# Patient Record
Sex: Male | Born: 1975 | Race: White | Hispanic: No | Marital: Single | Smoking: Never smoker
Health system: Southern US, Community
[De-identification: ages and names within clinical notes are randomized; demographics above are authoritative.]

## PROBLEM LIST (undated history)

## (undated) DIAGNOSIS — K219 Gastro-esophageal reflux disease without esophagitis: Secondary | ICD-10-CM

## (undated) DIAGNOSIS — K222 Esophageal obstruction: Secondary | ICD-10-CM

## (undated) DIAGNOSIS — D496 Neoplasm of unspecified behavior of brain: Secondary | ICD-10-CM

## (undated) DIAGNOSIS — R51 Headache: Secondary | ICD-10-CM

## (undated) DIAGNOSIS — D429 Neoplasm of uncertain behavior of meninges, unspecified: Secondary | ICD-10-CM

## (undated) DIAGNOSIS — D329 Benign neoplasm of meninges, unspecified: Secondary | ICD-10-CM

## (undated) DIAGNOSIS — R519 Headache, unspecified: Secondary | ICD-10-CM

## (undated) DIAGNOSIS — Z1379 Encounter for other screening for genetic and chromosomal anomalies: Secondary | ICD-10-CM

## (undated) HISTORY — PX: ANKLE SURGERY: SHX546

## (undated) HISTORY — DX: Encounter for other screening for genetic and chromosomal anomalies: Z13.79

## (undated) HISTORY — DX: Neoplasm of uncertain behavior of meninges, unspecified: D42.9

---

## 2001-03-05 DIAGNOSIS — D429 Neoplasm of uncertain behavior of meninges, unspecified: Secondary | ICD-10-CM

## 2001-03-05 HISTORY — DX: Neoplasm of uncertain behavior of meninges, unspecified: D42.9

## 2002-02-17 ENCOUNTER — Encounter: Admission: RE | Admit: 2002-02-17 | Discharge: 2002-02-17 | Payer: Self-pay | Admitting: *Deleted

## 2002-02-17 ENCOUNTER — Encounter: Payer: Self-pay | Admitting: *Deleted

## 2002-02-21 ENCOUNTER — Ambulatory Visit (HOSPITAL_COMMUNITY): Admission: RE | Admit: 2002-02-21 | Discharge: 2002-02-21 | Payer: Self-pay | Admitting: *Deleted

## 2002-02-21 ENCOUNTER — Encounter: Payer: Self-pay | Admitting: *Deleted

## 2002-12-01 ENCOUNTER — Encounter: Admission: RE | Admit: 2002-12-01 | Discharge: 2002-12-01 | Payer: Self-pay | Admitting: Gastroenterology

## 2002-12-01 ENCOUNTER — Encounter: Payer: Self-pay | Admitting: Gastroenterology

## 2007-08-12 ENCOUNTER — Encounter: Admission: RE | Admit: 2007-08-12 | Discharge: 2007-08-12 | Payer: Self-pay | Admitting: Neurosurgery

## 2009-02-02 ENCOUNTER — Emergency Department (HOSPITAL_COMMUNITY): Admission: EM | Admit: 2009-02-02 | Discharge: 2009-02-02 | Payer: Self-pay | Admitting: Emergency Medicine

## 2013-03-01 ENCOUNTER — Emergency Department (HOSPITAL_COMMUNITY)
Admission: EM | Admit: 2013-03-01 | Discharge: 2013-03-01 | Disposition: A | Discharge status: Home / Self Care | Payer: Self-pay | Attending: Emergency Medicine | Admitting: Emergency Medicine

## 2013-03-01 ENCOUNTER — Emergency Department (HOSPITAL_COMMUNITY): Payer: Self-pay

## 2013-03-01 ENCOUNTER — Encounter (HOSPITAL_COMMUNITY): Payer: Self-pay | Admitting: Emergency Medicine

## 2013-03-01 ENCOUNTER — Other Ambulatory Visit: Payer: Self-pay

## 2013-03-01 DIAGNOSIS — R079 Chest pain, unspecified: Secondary | ICD-10-CM | POA: Insufficient documentation

## 2013-03-01 DIAGNOSIS — Z79899 Other long term (current) drug therapy: Secondary | ICD-10-CM | POA: Insufficient documentation

## 2013-03-01 DIAGNOSIS — R112 Nausea with vomiting, unspecified: Secondary | ICD-10-CM | POA: Insufficient documentation

## 2013-03-01 DIAGNOSIS — Z8679 Personal history of other diseases of the circulatory system: Secondary | ICD-10-CM | POA: Insufficient documentation

## 2013-03-01 DIAGNOSIS — R1013 Epigastric pain: Secondary | ICD-10-CM | POA: Insufficient documentation

## 2013-03-01 DIAGNOSIS — Z7982 Long term (current) use of aspirin: Secondary | ICD-10-CM | POA: Insufficient documentation

## 2013-03-01 DIAGNOSIS — K222 Esophageal obstruction: Secondary | ICD-10-CM | POA: Insufficient documentation

## 2013-03-01 HISTORY — DX: Benign neoplasm of meninges, unspecified: D32.9

## 2013-03-01 HISTORY — DX: Esophageal obstruction: K22.2

## 2013-03-01 HISTORY — DX: Neoplasm of unspecified behavior of brain: D49.6

## 2013-03-01 LAB — HEPATIC FUNCTION PANEL
ALT: 40 U/L (ref 0–53)
AST: 34 U/L (ref 0–37)
Alkaline Phosphatase: 68 U/L (ref 39–117)
Bilirubin, Direct: 0.1 mg/dL (ref 0.0–0.3)
Indirect Bilirubin: 0.3 mg/dL (ref 0.3–0.9)
Total Bilirubin: 0.4 mg/dL (ref 0.3–1.2)

## 2013-03-01 LAB — CBC
HCT: 47.4 % (ref 39.0–52.0)
Hemoglobin: 16.6 g/dL (ref 13.0–17.0)
MCH: 30.5 pg (ref 26.0–34.0)
MCV: 87.1 fL (ref 78.0–100.0)
Platelets: 245 10*3/uL (ref 150–400)
RBC: 5.44 MIL/uL (ref 4.22–5.81)
RDW: 12.4 % (ref 11.5–15.5)
WBC: 14 10*3/uL — ABNORMAL HIGH (ref 4.0–10.5)

## 2013-03-01 LAB — BASIC METABOLIC PANEL
BUN: 14 mg/dL (ref 6–23)
CO2: 21 mEq/L (ref 19–32)
Chloride: 104 mEq/L (ref 96–112)
Creatinine, Ser: 0.89 mg/dL (ref 0.50–1.35)
GFR calc non Af Amer: >90 mL/min (ref >90)
Potassium: 3.9 mEq/L (ref 3.5–5.1)

## 2013-03-01 MED ORDER — SODIUM CHLORIDE 0.9 % IV BOLUS (SEPSIS)
1000.0000 mL | Freq: Once | INTRAVENOUS | Status: AC
  Administered 2013-03-01: 1000 mL via INTRAVENOUS

## 2013-03-01 MED ORDER — SUCRALFATE 1 GM/10ML PO SUSP: 1.0000 g | Freq: Three times a day (TID) | ORAL | Status: DC

## 2013-03-01 MED ORDER — ONDANSETRON HCL 4 MG/2ML IJ SOLN
4.0000 mg | Freq: Once | INTRAMUSCULAR | Status: AC
  Administered 2013-03-01: 4 mg via INTRAVENOUS

## 2013-03-01 MED ORDER — IOHEXOL 300 MG/ML  SOLN
50.0000 mL | Freq: Once | INTRAMUSCULAR | Status: AC | PRN
  Administered 2013-03-01: 50 mL via ORAL

## 2013-03-01 MED ORDER — MORPHINE SULFATE 4 MG/ML IJ SOLN
4.0000 mg | Freq: Once | INTRAMUSCULAR | Status: AC
  Administered 2013-03-01: 4 mg via INTRAVENOUS
  Filled 2013-03-01: qty 1

## 2013-03-01 MED ORDER — GI COCKTAIL ~~LOC~~
30.0000 mL | Freq: Once | ORAL | Status: AC
  Administered 2013-03-01: 30 mL via ORAL
  Filled 2013-03-01: qty 30

## 2013-03-01 MED ORDER — ONDANSETRON HCL 4 MG/2ML IJ SOLN
4.0000 mg | Freq: Once | INTRAMUSCULAR | Status: DC
  Filled 2013-03-01: qty 2

## 2013-03-01 MED ORDER — HYDROMORPHONE HCL PF 1 MG/ML IJ SOLN
1.0000 mg | Freq: Once | INTRAMUSCULAR | Status: AC
  Administered 2013-03-01: 1 mg via INTRAVENOUS
  Filled 2013-03-01: qty 1

## 2013-03-01 MED ORDER — PANTOPRAZOLE SODIUM 40 MG IV SOLR
40.0000 mg | Freq: Once | INTRAVENOUS | Status: AC
  Administered 2013-03-01: 40 mg via INTRAVENOUS
  Filled 2013-03-01: qty 40

## 2013-03-01 MED ORDER — SUCRALFATE 1 GM/10ML PO SUSP
1.0000 g | Freq: Once | ORAL | Status: AC
  Administered 2013-03-01: 1 g via ORAL
  Filled 2013-03-01: qty 10

## 2013-03-01 MED ORDER — HYDROCODONE-ACETAMINOPHEN 7.5-325 MG/15ML PO SOLN: 10.0000 mL | Freq: Four times a day (QID) | ORAL | Status: AC | PRN

## 2013-03-01 NOTE — ED Notes (Signed)
Pt actively vomiting in triage; states feels a little better after vomiting

## 2013-03-01 NOTE — ED Provider Notes (Signed)
CSN: [id number]     Arrival date & time 03/01/13  0346 History   First MD Initiated Contact with Patient 03/01/13 0423     Chief Complaint  Patient presents with  . Shortness of Breath   (Consider location/radiation/quality/duration/timing/severity/associated sxs/prior Treatment) HPI Comments: 37 yo male with acute onset of chest pain and vomiting since eating a slice of pizza a few hours before arrival. He vomited the pizza and has increased pain since then. It seems to be in the middle of his chest. The pain is worse with swallowing. Initially has some specks of blood in vomit but none since. Has never experienced pain like this before. Has some dyspnea when the pain is bad. Has a history of food impaction over 10 years ago and has been diagnosed with esophageal stricture. Last saw a gastroenterologist then.   Past Medical History  Diagnosis Date  . Esophageal stricture   . Brain tumor   . Meningioma    Past Surgical History  Procedure Laterality Date  . Ankle surgery     No family history on file. History  Substance Use Topics  . Smoking status: Never Smoker   . Smokeless tobacco: Not on file  . Alcohol Use: Not on file    Review of Systems  Constitutional: Negative for fever.  Respiratory: Negative for cough.   Cardiovascular: Positive for chest pain.  Gastrointestinal: Positive for nausea, vomiting and abdominal pain. Negative for diarrhea.    Allergies  Chicken allergy  Home Medications   Current Outpatient Rx  Name  Route  Sig  Dispense  Refill  . aspirin 325 MG tablet   Oral   Take 325 mg by mouth once.         . cetirizine (ZYRTEC) 10 MG tablet   Oral   Take 10 mg by mouth daily.         . Multiple Vitamin (MULTIVITAMIN WITH MINERALS) TABS tablet   Oral   Take 1 tablet by mouth daily.          BP 145/88  Pulse 91  Temp(Src) 98.8 F (37.1 C)  Resp 20  SpO2 96% Physical Exam  Nursing note and vitals reviewed. Constitutional: He is oriented  to person, place, and time. He appears well-developed and well-nourished.  Dry heaving, appears in pain  HENT:  Head: Normocephalic and atraumatic.  Right Ear: External ear normal.  Left Ear: External ear normal.  Nose: Nose normal.  Eyes: Right eye exhibits no discharge. Left eye exhibits no discharge.  Neck: Neck supple.  Cardiovascular: Normal rate, regular rhythm, normal heart sounds and intact distal pulses.   Pulmonary/Chest: Effort normal. He exhibits no tenderness.  No crepitus  Abdominal: Soft. There is tenderness in the epigastric area.  Musculoskeletal: He exhibits no edema.  Neurological: He is alert and oriented to person, place, and time.  Skin: Skin is warm and dry.    ED Course  Procedures (including critical care time) Labs Review Labs Reviewed  CBC - Abnormal; Notable for the following:    WBC 14.0 (*)    All other components within normal limits  BASIC METABOLIC PANEL - Abnormal; Notable for the following:    Glucose, Bld 152 (*)    All other components within normal limits  LIPASE, BLOOD  HEPATIC FUNCTION PANEL   Imaging Review Dg Chest Port 1 View  03/01/2013   CLINICAL DATA:  Chest pain after vomiting.  Shortness of breath.  EXAM: PORTABLE CHEST - 1 VIEW  COMPARISON:  None.  FINDINGS: The lungs are hypoexpanded but appear grossly clear. Mild vascular crowding is noted. There is no evidence of focal opacification, pleural effusion or pneumothorax.  The cardiomediastinal silhouette is within normal limits. No acute osseous abnormalities are seen.  IMPRESSION: Lungs hypoexpanded but grossly clear.   Electronically Signed   By: Roanna Raider M.D.   On: 03/01/2013 06:11    EKG Interpretation    Date/Time:  Sunday March 01 2013 06:12:22 EST Ventricular Rate:  86 PR Interval:  157 QRS Duration: 93 QT Interval:  352 QTC Calculation: 421 R Axis:   60 Text Interpretation:  Age not entered, assumed to be  37 years old for purpose of ECG interpretation  Sinus rhythm EKG WITHIN NORMAL LIMITS Artifact No old tracing to compare Confirmed by Avaree Gilberti  MD, Talana Slatten ([id number]) on 03/01/2013 7:47:22 AM            MDM   1. Chest pain    Patient's nausea controlled with zofran. Pain moderately helped by morphine and dilaudid. Given GI cocktail and protonix. Pain significantly improved but patient with significant pain each time he swallows. No trouble controlling his secretions. Concern for esophageal injury. CXR benign. D/w Dr. Madilyn Fireman (GI on call), who recommends liquid carafate for better pain control as well as getting a gastrograffin study and then calling back with results. Care transferred to Dr. Karma Ganja with that result pending.     Audree Camel, MD 03/01/13 [phone]

## 2013-03-01 NOTE — ED Notes (Signed)
Pt with history of esophageal stretching in the past; got choked tonight--coughed vigorously at that time; since then has had blood tinged sputum; shortness of breath and "pressure" upper chest with swallowing

## 2013-03-01 NOTE — ED Provider Notes (Signed)
8:13 AM received in signout from Dr. Criss Alvine, awaiting gastrograffin swallow and then will re-call Dr. Madilyn Fireman, GI,  with results.   10:33 AM I have discussed results with Dr. Madilyn Fireman, he recommends d/c home with clear liquids today, then soft mechanical diet tomorrow.  rx for carafate.  Pt to call tomorrow morning to obtain f/u with GI clinic- will need endoscopy as an outpatient.    Ethelda Chick, MD 03/01/13 1034

## 2013-03-01 NOTE — ED Notes (Signed)
Patient transported to X-ray 

## 2013-03-01 NOTE — ED Notes (Addendum)
MD at bedside. 

## 2016-04-26 ENCOUNTER — Encounter (HOSPITAL_BASED_OUTPATIENT_CLINIC_OR_DEPARTMENT_OTHER): Payer: Self-pay | Admitting: *Deleted

## 2016-04-26 ENCOUNTER — Emergency Department (HOSPITAL_BASED_OUTPATIENT_CLINIC_OR_DEPARTMENT_OTHER)
Admission: EM | Admit: 2016-04-26 | Discharge: 2016-04-26 | Disposition: A | Discharge status: Other Acute Inpatient Hospital | Payer: 59 | Attending: Emergency Medicine | Admitting: Emergency Medicine

## 2016-04-26 ENCOUNTER — Encounter (HOSPITAL_COMMUNITY)
Admission: EM | Disposition: A | Discharge status: Other Acute Inpatient Hospital | Payer: Self-pay | Source: Home / Self Care | Attending: Emergency Medicine

## 2016-04-26 DIAGNOSIS — T18128A Food in esophagus causing other injury, initial encounter: Secondary | ICD-10-CM | POA: Diagnosis not present

## 2016-04-26 DIAGNOSIS — Y999 Unspecified external cause status: Secondary | ICD-10-CM | POA: Insufficient documentation

## 2016-04-26 DIAGNOSIS — R0989 Other specified symptoms and signs involving the circulatory and respiratory systems: Secondary | ICD-10-CM | POA: Diagnosis present

## 2016-04-26 DIAGNOSIS — K222 Esophageal obstruction: Secondary | ICD-10-CM | POA: Diagnosis not present

## 2016-04-26 DIAGNOSIS — Y9389 Activity, other specified: Secondary | ICD-10-CM | POA: Insufficient documentation

## 2016-04-26 DIAGNOSIS — Y929 Unspecified place or not applicable: Secondary | ICD-10-CM | POA: Insufficient documentation

## 2016-04-26 DIAGNOSIS — X58XXXA Exposure to other specified factors, initial encounter: Secondary | ICD-10-CM | POA: Insufficient documentation

## 2016-04-26 DIAGNOSIS — R1310 Dysphagia, unspecified: Secondary | ICD-10-CM

## 2016-04-26 HISTORY — PX: ESOPHAGOGASTRODUODENOSCOPY: SHX5428

## 2016-04-26 SURGERY — EGD (ESOPHAGOGASTRODUODENOSCOPY)
Anesthesia: Moderate Sedation

## 2016-04-26 MED ORDER — FENTANYL CITRATE (PF) 100 MCG/2ML IJ SOLN
INTRAMUSCULAR | Status: AC
  Filled 2016-04-26: qty 4

## 2016-04-26 MED ORDER — FENTANYL CITRATE (PF) 100 MCG/2ML IJ SOLN
INTRAMUSCULAR | Status: DC | PRN
  Administered 2016-04-26 (×4): 25 ug via INTRAVENOUS

## 2016-04-26 MED ORDER — GLUCAGON HCL RDNA (DIAGNOSTIC) 1 MG IJ SOLR
1.0000 mg | Freq: Once | INTRAMUSCULAR | Status: AC
  Administered 2016-04-26: 1 mg via INTRAMUSCULAR
  Filled 2016-04-26: qty 1

## 2016-04-26 MED ORDER — DIPHENHYDRAMINE HCL 50 MG/ML IJ SOLN
INTRAMUSCULAR | Status: AC
  Filled 2016-04-26: qty 1

## 2016-04-26 MED ORDER — MIDAZOLAM HCL 10 MG/2ML IJ SOLN
INTRAMUSCULAR | Status: DC | PRN
  Administered 2016-04-26: 2 mg via INTRAVENOUS
  Administered 2016-04-26 (×2): 1 mg via INTRAVENOUS
  Administered 2016-04-26 (×3): 2 mg via INTRAVENOUS

## 2016-04-26 MED ORDER — OMEPRAZOLE 20 MG PO CPDR: 20.0000 mg | DELAYED_RELEASE_CAPSULE | Freq: Every day | ORAL | 0 refills | Status: DC

## 2016-04-26 MED ORDER — MIDAZOLAM HCL 5 MG/ML IJ SOLN
INTRAMUSCULAR | Status: AC
  Filled 2016-04-26: qty 3

## 2016-04-26 MED ORDER — BUTAMBEN-TETRACAINE-BENZOCAINE 2-2-14 % EX AERO
INHALATION_SPRAY | CUTANEOUS | Status: DC | PRN
  Administered 2016-04-26: 2 via TOPICAL

## 2016-04-26 MED ORDER — DIPHENHYDRAMINE HCL 50 MG/ML IJ SOLN
INTRAMUSCULAR | Status: DC | PRN
  Administered 2016-04-26 (×4): 12.5 mg via INTRAVENOUS

## 2016-04-26 NOTE — H&P (View-Only) (Signed)
Referring Provider: Dr. Regenia Skeeter Primary Care Physician:  Helane Rima, MD Primary Gastroenterologist:  Dr. Oletta Lamas  Reason for Consultation:  Food Impaction  HPI: David Contreras is a 41 y.o. male with history of an esophageal stricture and reported dilation in the past by Dr. Oletta Lamas (unable to find EGD report on Epic or on office chart) who has intermittent solid food dysphagia over the years. While eating steak yesterday he felt it get stuck after finishing half of the meal and has been unable to swallow saliva or fluids since then. Denies vomiting up any of the food. Trouble usually happens with steak. Denies heartburn. Denies abdominal pain.  Past Medical History:  Diagnosis Date  . Brain tumor ([place])   . Esophageal stricture   . Meningioma [facility])     Past Surgical History:  Procedure Laterality Date  . ANKLE SURGERY      Prior to Admission medications   Medication Sig Start Date End Date Taking? Authorizing Provider  fexofenadine (ALLEGRA) 180 MG tablet Take 180 mg by mouth daily.   Yes Historical Provider, MD  Multiple Vitamin (MULTIVITAMIN WITH MINERALS) TABS tablet Take 1 tablet by mouth daily.   Yes Historical Provider, MD  aspirin 325 MG tablet Take 325 mg by mouth once.    Historical Provider, MD  cetirizine (ZYRTEC) 10 MG tablet Take 10 mg by mouth daily.    Historical Provider, MD    Scheduled Meds: Continuous Infusions: PRN Meds:.  Allergies as of 04/26/2016 - Review Complete 04/26/2016  Allergen Reaction Noted  . Chicken allergy Swelling 03/01/2013    History reviewed. No pertinent family history.  Social History   Social History  . Marital status: Single    Spouse name: N/A  . Number of children: N/A  . Years of education: N/A   Occupational History  . Not on file.   Social History Main Topics  . Smoking status: Never Smoker  . Smokeless tobacco: Never Used  . Alcohol use Yes  . Drug use: Unknown  . Sexual activity: Not on file   Other  Topics Concern  . Not on file   Social History Narrative  . No narrative on file    Review of Systems: All negative except as stated above in HPI.  Physical Exam: Vital signs: Vitals:   04/26/16 1950 04/26/16 1955  BP: 150/85 151/86  Pulse: 83 71  Resp: 20 21  Temp:       General:   Alert,  Well-developed, well-nourished, pleasant and cooperative in NAD HEENT: anicteric sclera, oropharynx clear Neck: supple, nontender Lungs:  Clear throughout to auscultation.   No wheezes, crackles, or rhonchi. No acute distress. Heart:  Regular rate and rhythm; no murmurs, clicks, rubs,  or gallops. Abdomen: soft, nontender, nondistended, +BS  Rectal:  Deferred Ext: no edema  GI:  Lab Results: No results for input(s): WBC, HGB, HCT, PLT in the last 72 hours. BMET No results for input(s): NA, K, CL, CO2, GLUCOSE, BUN, CREATININE, CALCIUM in the last 72 hours. LFT No results for input(s): PROT, ALBUMIN, AST, ALT, ALKPHOS, BILITOT, BILIDIR, IBILI in the last 72 hours. PT/INR No results for input(s): LABPROT, INR in the last 72 hours.   Studies/Results: No results found.  Impression/Plan: 41 yo with intermittent solid food dysphagia with presentation concerning for a food impaction. 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 Will do EGD for clearance of food impaction and he will need f/u EGD in March for dilation of his esophagus. Will do EGD at  bedside in ER. His father is with him to drive him home.    LOS: 0 days   [place]ier C.  04/26/2016, 8:01 PM  Pager [phone]  If no answer or after 5 PM call [phone]

## 2016-04-26 NOTE — ED Provider Notes (Signed)
[facility] DE[facility] Provider Note   CSN: [id number] Arrival date & time: 04/26/16  1337     History   Chief Complaint Chief Complaint  Patient presents with  . Esophageal Stricture    HPI David Contreras is a 41 y.o. male.  HPI Patient has a previous history of esophageal stricture. Has had food get caught in the past and has had to even get it endoscopically removed. States has seen Dr. Oletta Lamas in the past. Around 24 hours ago he had some steak and felt it get caught. States he has been able to eat or drink anything since then. He has been spitting up his secretions. No fevers. States he has eaten very well today.   Past Medical History:  Diagnosis Date  . Brain tumor ([place])   . Esophageal stricture   . Meningioma ([place])     There are no active problems to display for this patient.   Past Surgical History:  Procedure Laterality Date  . ANKLE SURGERY         Home Medications    Prior to Admission medications   Medication Sig Start Date End Date Taking? Authorizing Provider  aspirin 325 MG tablet Take 325 mg by mouth once.    Historical Provider, MD  cetirizine (ZYRTEC) 10 MG tablet Take 10 mg by mouth daily.    Historical Provider, MD  Multiple Vitamin (MULTIVITAMIN WITH MINERALS) TABS tablet Take 1 tablet by mouth daily.    Historical Provider, MD  sucralfate (CARAFATE) 1 GM/10ML suspension Take 10 mLs (1 g total) by mouth 4 (four) times daily -  with meals and at bedtime. 03/01/13   Alfonzo Beers, MD    Family History History reviewed. No pertinent family history.  Social History Social History  Substance Use Topics  . Smoking status: Never Smoker  . Smokeless tobacco: Never Used  . Alcohol use Yes     Allergies   Chicken allergy   Review of Systems Review of Systems  Constitutional: Negative for activity change, appetite change and fever.  HENT: Negative for ear pain.   Eyes: Negative for pain.  Respiratory: Negative for chest tightness  and shortness of breath.   Cardiovascular: Negative for chest pain and leg swelling.  Gastrointestinal: Positive for nausea and vomiting. Negative for abdominal pain, constipation and diarrhea.  Genitourinary: Negative for flank pain.  Musculoskeletal: Negative for back pain and neck stiffness.  Skin: Negative for rash.  Neurological: Negative for weakness, numbness and headaches.  Psychiatric/Behavioral: Negative for behavioral problems.     Physical Exam Updated Vital Signs BP 127/85 (BP Location: Left Arm)   Pulse 73   Temp 98.6 F (37 C) (Oral)   Resp 18   Ht 5\' 11"  (1.803 m)   Wt 236 lb (107 kg)   SpO2 98%   BMI 32.92 kg/m   Physical Exam  Constitutional: He appears well-developed.  HENT:  Head: Atraumatic.  Eyes: EOM are normal.  Neck: Neck supple.  Cardiovascular: Normal rate.   Pulmonary/Chest: Effort normal.  Abdominal: Soft.  Musculoskeletal: He exhibits no edema.  Neurological: He is alert.  Skin: Skin is warm. Capillary refill takes less than 2 seconds.     ED Treatments / Results  Labs (all labs ordered are listed, but only abnormal results are displayed) Labs Reviewed - No data to display  EKG  EKG Interpretation None       Radiology No results found.  Procedures Procedures (including critical care time)  Medications Ordered in ED  Medications  glucagon (human recombinant) (GLUCAGEN) injection 1 mg (1 mg Intramuscular Given 04/26/16 1530)     Initial Impression / Assessment and Plan / ED Course  I have reviewed the triage vital signs and the nursing notes.  Pertinent labs & imaging results that were available during my care of the patient were reviewed by me and considered in my medical decision making (see chart for details).     Patient with likely esophageal meat impaction. History of stricture that has required dilation in the past. Unable to pass fluids here. Discussed with Dr. Amedeo Plenty and Dr. Laverta Baltimore will transfer to [facility] to be seen by [facility].  Final Clinical Impressions(s) / ED Diagnoses   Final diagnoses:  Esophageal obstruction due to food impaction    New Prescriptions New Prescriptions   No medications on file     [name], MD 04/26/16 1735

## 2016-04-26 NOTE — Consult Note (Signed)
Referring Provider: Dr. Regenia Skeeter Primary Care Physician:  Helane Rima, MD Primary Gastroenterologist:  Dr. Oletta Lamas  Reason for Consultation:  Food Impaction  HPI: David Contreras is a 41 y.o. male with history of an esophageal stricture and reported dilation in the past by Dr. Oletta Lamas (unable to find EGD report on Epic or on office chart) who has intermittent solid food dysphagia over the years. While eating steak yesterday he felt it get stuck after finishing half of the meal and has been unable to swallow saliva or fluids since then. Denies vomiting up any of the food. Trouble usually happens with steak. Denies heartburn. Denies abdominal pain.  Past Medical History:  Diagnosis Date  . Brain tumor ([place])   . Esophageal stricture   . Meningioma [facility])     Past Surgical History:  Procedure Laterality Date  . ANKLE SURGERY      Prior to Admission medications   Medication Sig Start Date End Date Taking? Authorizing Provider  fexofenadine (ALLEGRA) 180 MG tablet Take 180 mg by mouth daily.   Yes Historical Provider, MD  Multiple Vitamin (MULTIVITAMIN WITH MINERALS) TABS tablet Take 1 tablet by mouth daily.   Yes Historical Provider, MD  aspirin 325 MG tablet Take 325 mg by mouth once.    Historical Provider, MD  cetirizine (ZYRTEC) 10 MG tablet Take 10 mg by mouth daily.    Historical Provider, MD    Scheduled Meds: Continuous Infusions: PRN Meds:.  Allergies as of 04/26/2016 - Review Complete 04/26/2016  Allergen Reaction Noted  . Chicken allergy Swelling 03/01/2013    History reviewed. No pertinent family history.  Social History   Social History  . Marital status: Single    Spouse name: N/A  . Number of children: N/A  . Years of education: N/A   Occupational History  . Not on file.   Social History Main Topics  . Smoking status: Never Smoker  . Smokeless tobacco: Never Used  . Alcohol use Yes  . Drug use: Unknown  . Sexual activity: Not on file   Other  Topics Concern  . Not on file   Social History Narrative  . No narrative on file    Review of Systems: All negative except as stated above in HPI.  Physical Exam: Vital signs: Vitals:   04/26/16 1950 04/26/16 1955  BP: 150/85 151/86  Pulse: 83 71  Resp: 20 21  Temp:       General:   Alert,  Well-developed, well-nourished, pleasant and cooperative in NAD HEENT: anicteric sclera, oropharynx clear Neck: supple, nontender Lungs:  Clear throughout to auscultation.   No wheezes, crackles, or rhonchi. No acute distress. Heart:  Regular rate and rhythm; no murmurs, clicks, rubs,  or gallops. Abdomen: soft, nontender, nondistended, +BS  Rectal:  Deferred Ext: no edema  GI:  Lab Results: No results for input(s): WBC, HGB, HCT, PLT in the last 72 hours. BMET No results for input(s): NA, K, CL, CO2, GLUCOSE, BUN, CREATININE, CALCIUM in the last 72 hours. LFT No results for input(s): PROT, ALBUMIN, AST, ALT, ALKPHOS, BILITOT, BILIDIR, IBILI in the last 72 hours. PT/INR No results for input(s): LABPROT, INR in the last 72 hours.   Studies/Results: No results found.  Impression/Plan: 41 yo with intermittent solid food dysphagia with presentation concerning for a food impaction. 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 Will do EGD for clearance of food impaction and he will need f/u EGD in March for dilation of his esophagus. Will do EGD at  bedside in ER. His father is with him to drive him home.    LOS: 0 days   [place] C.  04/26/2016, 8:01 PM  Pager [phone]  If no answer or after 5 PM call [phone]

## 2016-04-26 NOTE — Discharge Instructions (Signed)
Only clear liquids tonight such as water, juice (non-citric juices), and soda. Starting tomorrow can try soups and broths and if tolerated advance to soft diet. NO meats at all until repeat EGD done next week. Start Omeprazole 20 mg once a day starting tomorrow. [facility] will call to arrange EGD.

## 2016-04-26 NOTE — ED Provider Notes (Signed)
Patient transferred from [facility] with food impaction. GI contacted by Charge nurse. Stable. Waiting on GI to remove impaction  Food impaction removed. Patient will be discharged home to follow-up with [facility] GI as an outpatient.   Sherwood Gambler, MD 04/26/16 2158

## 2016-04-26 NOTE — Interval H&P Note (Signed)
History and Physical Interval Note:  04/26/2016 8:12 PM  David Contreras  has presented today for surgery, with the diagnosis of food impaction  The various methods of treatment have been discussed with the patient and family. After consideration of risks, benefits and other options for treatment, the patient has consented to  Procedure(s): ESOPHAGOGASTRODUODENOSCOPY (EGD) (N/A) as a surgical intervention .  The patient's history has been reviewed, patient examined, no change in status, stable for surgery.  I have reviewed the patient's chart and labs.  Questions were answered to the patient's satisfaction.     [place] C.

## 2016-04-26 NOTE — ED Notes (Signed)
Pt arrived to [facility].  Will notify provider.

## 2016-04-26 NOTE — ED Triage Notes (Signed)
He has food stuck in his esophagus. Hx of same. He is not able to swallow water without reflux. No respiratory problems.

## 2016-04-26 NOTE — Op Note (Signed)
[facility] Patient Name: David Contreras Procedure Date: 04/26/2016 MRN: [id number] Attending MD: Lear Ng , MD Date of Birth: [birth year] CSN: [id number] Age: 41 Admit Type: Outpatient Procedure:                Upper GI endoscopy Indications:              Dysphagia, Foreign body in the esophagus Providers:                Lear Ng, MD, Dortha Schwalbe RN, RN,                            Alfonso Patten, Technician Referring MD:              Medicines:                Fentanyl 100 micrograms IV, Midazolam 10 mg IV,                            Diphenhydramine 50 mg IV, Cetacaine spray Complications:            No immediate complications. Estimated Blood Loss:     Estimated blood loss was minimal. Procedure:                Pre-Anesthesia Assessment:                           - Prior to the procedure, a History and Physical                            was performed, and patient medications and                            allergies were reviewed. The patient's tolerance of                            previous anesthesia was also reviewed. The risks                            and benefits of the procedure and the sedation                            options and risks were discussed with the patient.                            All questions were answered, and informed consent                            was obtained. Prior Anticoagulants: The patient has                            taken no previous anticoagulant or antiplatelet                            agents. ASA Grade Assessment: I - A normal, healthy  patient. After reviewing the risks and benefits,                            the patient was deemed in satisfactory condition to                            undergo the procedure.                           After obtaining informed consent, the endoscope was                            passed under direct vision. Throughout the                             procedure, the patient's blood pressure, pulse, and                            oxygen saturations were monitored continuously. The                            [id number]) scope was introduced through the                            mouth, and advanced to the lower third of                            esophagus. The upper GI endoscopy was performed                            with difficulty due to stricture. Successful                            completion of the procedure was aided by {skip}. Scope In: Scope Out: Findings:      One severe (stenosis; an endoscope cannot pass) benign-appearing,       intrinsic stenosis was found 28 cm from the incisors. Standard endoscope       (9 mm diameter) would not pass and pediatric endoscope (8 mm diameter)       would not pass. No smaller endoscope available. Stricture less than 8 mm       and was not traversed. The lesion was not amenable to dilation, and this       was not attempted due to trauma from the food impaction.      Food was found in the mid esophagus. Removal of food was accomplished.      Unable to advance standard or pediatric endoscope through the stricture       to evaluate the remaining portion of the esophagus or visualize the       stomach or duodenum.      Mucosal changes including longitudinal furrows were found in the mid       esophagus. Impression:               - Benign-appearing esophageal stenosis. Lesion not  amenable to dilation, and not attempted.                           - Food in the mid esophagus. Removal was successful.                           - Esophageal mucosal changes suspicious for                            eosinophilic esophagitis. Moderate Sedation:      Moderate (conscious) sedation was administered by the endoscopy nurse       and supervised by the endoscopist. The following parameters were       monitored: oxygen saturation, heart rate, blood pressure, and  response       to care. Recommendation:           - Patient has a contact number available for                            emergencies. The signs and symptoms of potential                            delayed complications were discussed with the                            patient. Return to normal activities tomorrow.                            Written discharge instructions were provided to the                            patient.                           - Clear liquid diet tonight and then start full                            liquids tomorrow and advance as tolerated to soft                            foods. NO meats until repeat EGD with dilation done.                           - Repeat upper endoscopy in 1 week for dilation of                            the stricture.                           - Use Prilosec (omeprazole) 20 mg PO daily.                           - Post procedure medication orders were given. Procedure Code(s):        --- Professional ---  [id number], Esophagoscopy, flexible, transoral; with                            removal of foreign body(s) Diagnosis Code(s):        --- Professional ---                           K22.2, Esophageal obstruction                           [id number], Food in esophagus causing other injury,                            initial encounter                           T18.108A, Unspecified foreign body in esophagus                            causing other injury, initial encounter                           R13.10, Dysphagia, unspecified                           K20.9, Esophagitis, unspecified CPT copyright 2016 [employer]. All rights reserved. The codes documented in this report are preliminary and upon coder review may  be revised to meet current compliance requirements. Lear Ng, MD 04/26/2016 9:00:25 PM This report has been signed electronically. Number of Addenda: 0

## 2016-04-26 NOTE — ED Notes (Signed)
States" I ate steak about 24 hrs ago and I feel like I have a piece struck in my throat" Has had prior incident of same and will resolve in 5 hrs or less. Denies pain

## 2016-04-26 NOTE — ED Notes (Signed)
To [facility], via POV, no questions regarding transfer.

## 2016-04-26 NOTE — ED Notes (Signed)
Post procedure report from nurse Dr. Michail Sermon wants all liquid diet  Pt needs to return next week to stretch esophageal lining  100 of fentyle  10 of versed  50 benadryl

## 2016-04-26 NOTE — ED Notes (Signed)
Placed on monitor and v/s check every 5 mins per nurse request

## 2016-04-26 NOTE — ED Notes (Signed)
ED Provider at bedside.Denies pain but no change in FB in throat . Unable to tol fluids

## 2016-04-26 NOTE — ED Notes (Signed)
Bed: WA25 Expected date:  Expected time:  Means of arrival:  Comments: [facility] 

## 2016-04-26 NOTE — ED Notes (Signed)
Patient denies pain and is resting comfortably.  

## 2016-04-26 NOTE — Brief Op Note (Signed)
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 Food removed but unable to pass a standard endoscope or a pediatric endoscope through the stricture. Clear liquid diet only today and then tomorrow can do full liquids and advance to soft food. No meat at all until dilation done and will plan to do dilation next week. Start Omeprazole 20 mg once a day starting tomorrow. Office will call to arrange. D/W his father.

## 2016-04-26 NOTE — ED Triage Notes (Signed)
Pt here with food bolus.  Eating steak and feels it is caught

## 2016-04-27 ENCOUNTER — Other Ambulatory Visit: Payer: Self-pay | Admitting: Gastroenterology

## 2016-04-27 ENCOUNTER — Encounter (HOSPITAL_COMMUNITY): Payer: Self-pay | Admitting: Gastroenterology

## 2016-05-02 ENCOUNTER — Encounter (HOSPITAL_COMMUNITY): Payer: Self-pay | Admitting: Certified Registered Nurse Anesthetist

## 2016-05-03 ENCOUNTER — Encounter (HOSPITAL_COMMUNITY)
Admission: AD | Disposition: A | Discharge status: Home / Self Care | Payer: Self-pay | Source: Ambulatory Visit | Attending: Gastroenterology

## 2016-05-03 ENCOUNTER — Ambulatory Visit (HOSPITAL_COMMUNITY): Payer: 59 | Admitting: Anesthesiology

## 2016-05-03 ENCOUNTER — Encounter (HOSPITAL_COMMUNITY): Payer: Self-pay | Admitting: *Deleted

## 2016-05-03 ENCOUNTER — Encounter (HOSPITAL_COMMUNITY): Admission: RE | Payer: Self-pay | Source: Ambulatory Visit

## 2016-05-03 ENCOUNTER — Ambulatory Visit (HOSPITAL_COMMUNITY): Admission: RE | Admit: 2016-05-03 | Payer: 59 | Source: Ambulatory Visit | Admitting: Gastroenterology

## 2016-05-03 ENCOUNTER — Ambulatory Visit (HOSPITAL_COMMUNITY): Payer: 59

## 2016-05-03 ENCOUNTER — Ambulatory Visit (HOSPITAL_COMMUNITY)
Admission: AD | Admit: 2016-05-03 | Discharge: 2016-05-03 | Disposition: A | Discharge status: Home / Self Care | Payer: 59 | Source: Ambulatory Visit | Attending: Gastroenterology | Admitting: Gastroenterology

## 2016-05-03 DIAGNOSIS — K2900 Acute gastritis without bleeding: Secondary | ICD-10-CM | POA: Diagnosis not present

## 2016-05-03 DIAGNOSIS — K222 Esophageal obstruction: Secondary | ICD-10-CM | POA: Diagnosis not present

## 2016-05-03 DIAGNOSIS — Z7982 Long term (current) use of aspirin: Secondary | ICD-10-CM | POA: Diagnosis not present

## 2016-05-03 DIAGNOSIS — Z86011 Personal history of benign neoplasm of the brain: Secondary | ICD-10-CM | POA: Insufficient documentation

## 2016-05-03 DIAGNOSIS — Z79899 Other long term (current) drug therapy: Secondary | ICD-10-CM | POA: Insufficient documentation

## 2016-05-03 HISTORY — PX: ESOPHAGOGASTRODUODENOSCOPY (EGD) WITH PROPOFOL: SHX5813

## 2016-05-03 HISTORY — PX: BALLOON DILATION: SHX5330

## 2016-05-03 SURGERY — ESOPHAGOGASTRODUODENOSCOPY (EGD) WITH PROPOFOL
Anesthesia: Monitor Anesthesia Care

## 2016-05-03 MED ORDER — LIDOCAINE 2% (20 MG/ML) 5 ML SYRINGE
INTRAMUSCULAR | Status: DC | PRN
  Administered 2016-05-03 (×2): 80 mg via INTRAVENOUS

## 2016-05-03 MED ORDER — EPHEDRINE SULFATE-NACL 50-0.9 MG/10ML-% IV SOSY
PREFILLED_SYRINGE | INTRAVENOUS | Status: DC | PRN
  Administered 2016-05-03: 5 mg via INTRAVENOUS
  Administered 2016-05-03: 10 mg via INTRAVENOUS

## 2016-05-03 MED ORDER — PROPOFOL 10 MG/ML IV BOLUS
INTRAVENOUS | Status: DC | PRN
  Administered 2016-05-03: 30 mg via INTRAVENOUS
  Administered 2016-05-03 (×5): 50 mg via INTRAVENOUS

## 2016-05-03 MED ORDER — PROPOFOL 500 MG/50ML IV EMUL
INTRAVENOUS | Status: DC | PRN
  Administered 2016-05-03: 120 ug/kg/min via INTRAVENOUS

## 2016-05-03 MED ORDER — SODIUM CHLORIDE 0.9 % IV SOLN: INTRAVENOUS | Status: DC

## 2016-05-03 MED ORDER — GLYCOPYRROLATE 0.2 MG/ML IV SOSY
PREFILLED_SYRINGE | INTRAVENOUS | Status: DC | PRN
  Administered 2016-05-03: .2 mg via INTRAVENOUS

## 2016-05-03 MED ORDER — PHENYLEPHRINE 40 MCG/ML (10ML) SYRINGE FOR IV PUSH (FOR BLOOD PRESSURE SUPPORT)
PREFILLED_SYRINGE | INTRAVENOUS | Status: DC | PRN
  Administered 2016-05-03: 80 ug via INTRAVENOUS
  Administered 2016-05-03 (×2): 120 ug via INTRAVENOUS
  Administered 2016-05-03: 80 ug via INTRAVENOUS

## 2016-05-03 MED ORDER — LACTATED RINGERS IV SOLN
INTRAVENOUS | Status: DC | PRN
  Administered 2016-05-03: 10:00:00 via INTRAVENOUS

## 2016-05-03 NOTE — H&P (Signed)
Date of Initial H&P: 04/27/16  History reviewed, patient examined, no change in status, stable for surgery.

## 2016-05-03 NOTE — Transfer of Care (Signed)
Immediate Anesthesia Transfer of Care Note  Patient: MILAD BUBLITZ  Procedure(s) Performed: Procedure(s): ESOPHAGOGASTRODUODENOSCOPY (EGD) WITH PROPOFOL (N/A) BALLOON DILATION (N/A)  Patient Location: PACU and Endoscopy Unit  Anesthesia Type:MAC  Level of Consciousness: awake, alert , oriented and patient cooperative  Airway & Oxygen Therapy: Patient Spontanous Breathing and Patient connected to nasal cannula oxygen  Post-op Assessment: Report given to RN, Post -op Vital signs reviewed and stable and Patient moving all extremities X 4  Post vital signs: Reviewed and stable  Last Vitals:  Vitals:   05/03/16 0923  BP: (!) 168/96  Pulse: 74  Resp: (!) 22  Temp: 36.7 C    Last Pain:  Vitals:   05/03/16 0923  TempSrc: Oral         Complications: No apparent anesthesia complications

## 2016-05-03 NOTE — Discharge Instructions (Signed)

## 2016-05-03 NOTE — Anesthesia Preprocedure Evaluation (Addendum)
Anesthesia Evaluation  Patient identified by MRN, date of birth, ID band Patient awake    Reviewed: Allergy & Precautions, NPO status , Patient's Chart, lab work & pertinent test results  Airway Mallampati: II  TM Distance: >3 FB Neck ROM: Full    Dental   Pulmonary neg pulmonary ROS,    breath sounds clear to auscultation       Cardiovascular negative cardio ROS   Rhythm:Regular Rate:Normal     Neuro/Psych Meningioma    GI/Hepatic Esophageal stricture   Endo/Other  negative endocrine ROS  Renal/GU negative Renal ROS     Musculoskeletal   Abdominal   Peds  Hematology negative hematology ROS (+)   Anesthesia Other Findings   Reproductive/Obstetrics                            Anesthesia Physical Anesthesia Plan  ASA: II  Anesthesia Plan: MAC   Post-op Pain Management:    Induction: Intravenous  Airway Management Planned: Natural Airway and Nasal Cannula  Additional Equipment:   Intra-op Plan:   Post-operative Plan:   Informed Consent: I have reviewed the patients History and Physical, chart, labs and discussed the procedure including the risks, benefits and alternatives for the proposed anesthesia with the patient or authorized representative who has indicated his/her understanding and acceptance.   Dental advisory given  Plan Discussed with:   Anesthesia Plan Comments:         Anesthesia Quick Evaluation

## 2016-05-03 NOTE — Interval H&P Note (Signed)
History and Physical Interval Note:  05/03/2016 9:23 AM  David Contreras  has presented today for surgery, with the diagnosis of dysphagia  The various methods of treatment have been discussed with the patient and family. After consideration of risks, benefits and other options for treatment, the patient has consented to  Procedure(s): ESOPHAGOGASTRODUODENOSCOPY (EGD) WITH PROPOFOL (N/A) BALLOON DILATION (N/A) as a surgical intervention .  The patient's history has been reviewed, patient examined, no change in status, stable for surgery.  I have reviewed the patient's chart and labs.  Questions were answered to the patient's satisfaction.     [place] C.

## 2016-05-03 NOTE — Op Note (Signed)
[facility] Patient Name: David Contreras Procedure Date : 05/03/2016 MRN: [id number] Attending MD: Lear Ng , MD Date of Birth: 1975/11/02 CSN: [id number] Age: 41 Admit Type: Outpatient Procedure:                Upper GI endoscopy Indications:              Dysphagia, Stricture of the esophagus Providers:                Lear Ng, MD, Vista Lawman, RN, Elspeth Cho Tech., Technician, Cathe Mons, CRNA Referring MD:              Medicines:                Propofol per Anesthesia, Monitored Anesthesia Care Complications:            No immediate complications. Estimated Blood Loss:     Estimated blood loss was minimal. Procedure:                Pre-Anesthesia Assessment:                           - Prior to the procedure, a History and Physical                            was performed, and patient medications and                            allergies were reviewed. The patient's tolerance of                            previous anesthesia was also reviewed. The risks                            and benefits of the procedure and the sedation                            options and risks were discussed with the patient.                            All questions were answered, and informed consent                            was obtained. Prior Anticoagulants: The patient has                            taken no previous anticoagulant or antiplatelet                            agents. ASA Grade Assessment: III - A patient with                            severe systemic disease. After reviewing the risks  and benefits, the patient was deemed in                            satisfactory condition to undergo the procedure.                           After obtaining informed consent, the endoscope was                            passed under direct vision. Throughout the                            procedure, the  patient's blood pressure, pulse, and                            oxygen saturations were monitored continuously. The                            EG-[id number] ([id number]) scope was introduced through the                            mouth, and advanced to the second part of duodenum.                            The upper GI endoscopy was accomplished without                            difficulty. The patient tolerated the procedure                            well. Scope In: Scope Out: Findings:      One severe (stenosis; an endoscope cannot pass) benign-appearing,       intrinsic stenosis was found 28 cm from the incisors. And was traversed       after dilation. A guidewire was placed under fluoroscopic guidance and       the scope was withdrawn. Dilation was performed with a Savary dilator       with no resistance at 7 mm, mild resistance at 8 mm, 9 mm and 10 mm and       moderate resistance at 11 mm and 12 mm. Estimated blood loss was minimal.      Localized mild inflammation characterized by congestion (edema) and       erythema was found in the gastric antrum.      The examined duodenum was normal.      The Z-line was regular and was found 40 cm from the incisors. Impression:               - Benign-appearing esophageal stenosis. Dilated.                           - Acute gastritis.                           - Normal examined duodenum.                           -  No specimens collected. Moderate Sedation:      N/A - MAC procedure Recommendation:           - Use Prilosec (omeprazole) 20 mg PO daily.                           - Return to GI office in 2 months.                           - Advance diet as tolerated and clear liquid diet.                           - Post procedure medication orders were given.                           - Patient has a contact number available for                            emergencies. The signs and symptoms of potential                            delayed  complications were discussed with the                            patient. Return to normal activities tomorrow.                            Written discharge instructions were provided to the                            patient. Procedure Code(s):        --- Professional ---                           [phone], Esophagogastroduodenoscopy, flexible,                            transoral; with insertion of guide wire followed by                            passage of dilator(s) through esophagus over guide                            wire Diagnosis Code(s):        --- Professional ---                           R13.10, Dysphagia, unspecified                           K22.2, Esophageal obstruction                           K29.00, Acute gastritis without bleeding CPT copyright 2016 American Medical Association. All rights reserved. The codes documented in this report are preliminary and upon coder review may  be revised to meet current compliance  requirements. Lear Ng, MD 05/03/2016 10:28:51 AM This report has been signed electronically. Number of Addenda: 0

## 2016-05-04 ENCOUNTER — Encounter (HOSPITAL_COMMUNITY): Payer: Self-pay | Admitting: Gastroenterology

## 2016-05-04 NOTE — Anesthesia Postprocedure Evaluation (Signed)
Anesthesia Post Note  Patient: David Contreras  Procedure(s) Performed: Procedure(s) (LRB): ESOPHAGOGASTRODUODENOSCOPY (EGD) WITH PROPOFOL (N/A) BALLOON DILATION (N/A)  Patient location during evaluation: PACU Anesthesia Type: MAC Level of consciousness: awake and alert Pain management: pain level controlled Vital Signs Assessment: post-procedure vital signs reviewed and stable Respiratory status: spontaneous breathing, nonlabored ventilation, respiratory function stable and patient connected to nasal cannula oxygen Cardiovascular status: stable and blood pressure returned to baseline Anesthetic complications: no       Last Vitals:  Vitals:   05/03/16 1035 05/03/16 1040  BP: 135/75   Pulse: 74 65  Resp: 17 19  Temp:      Last Pain:  Vitals:   05/03/16 1025  TempSrc: Oral                 Tiajuana Amass

## 2017-04-30 ENCOUNTER — Other Ambulatory Visit: Payer: Self-pay | Admitting: Neurosurgery

## 2017-04-30 ENCOUNTER — Other Ambulatory Visit (HOSPITAL_COMMUNITY): Payer: Self-pay | Admitting: Neurosurgery

## 2017-04-30 DIAGNOSIS — D329 Benign neoplasm of meninges, unspecified: Secondary | ICD-10-CM

## 2017-05-03 ENCOUNTER — Inpatient Hospital Stay: Payer: 59

## 2017-05-03 ENCOUNTER — Encounter: Payer: Self-pay | Admitting: Genetic Counselor

## 2017-05-03 ENCOUNTER — Inpatient Hospital Stay: Payer: 59 | Attending: Genetic Counselor | Admitting: Genetic Counselor

## 2017-05-03 DIAGNOSIS — Z7183 Encounter for nonprocreative genetic counseling: Secondary | ICD-10-CM | POA: Diagnosis not present

## 2017-05-03 DIAGNOSIS — D429 Neoplasm of uncertain behavior of meninges, unspecified: Secondary | ICD-10-CM | POA: Diagnosis not present

## 2017-05-03 DIAGNOSIS — Z800 Family history of malignant neoplasm of digestive organs: Secondary | ICD-10-CM | POA: Diagnosis not present

## 2017-05-03 NOTE — Progress Notes (Signed)
[facility]      Initial Visit   Patient Name: David Contreras Patient DOB: [birth year] Patient Age: 42 y.o. Encounter Date: 05/03/2017  Referring Provider: Erline Levine, MD  Primary Care Provider: Helane Rima, MD  Reason for Visit: Evaluate for hereditary susceptibility to cancer    Assessment and Plan:  . Mr. Carlile' personal history of multiple meningiomas warrants a genetics evaluation, but he has no other features suggestive of a syndromic cause to these. He does not have vestibular schwannomas that are typical in NF2 or any other NF2-associated findings other than meningiomas. Mutations in [place] can be associated with multiple meningiomas, but would also manifest with multiple schwannomas, which he does not have. The great majority of individuals with multiple meningiomas do not harbor a SMARCB1 mutation.  . Testing is recommended to determine whether he has a pathogenic mutation that will impact his future screening.  . Mr. Koudelka wished to pursue genetic testing and a blood sample will be sent for analysis of the 83 genes on [facility]'s Multi-Cancer panel (ALK, APC, ATM, AXIN2, BAP1, BARD1, BLM, BMPR1A, BRCA1, BRCA2, BRIP1, CASR, CDC73, CDH1, CDK4, CDKN1B, CDKN1C, CDKN2A, CEBPA, CHEK2, CTNNA1, DICER1, DIS3L2, EGFR, EPCAM, FH, FLCN, GATA2, GPC3, GREM1, HOXB13, HRAS, KIT, MAX, MEN1, MET, MITF, MLH1, MSH2, MSH3, MSH6, MUTYH, NBN, NF1, NF2, NTHL1, PALB2, PDGFRA, PHOX2B, PMS2, POLD1, POLE, POT1, PRKAR1A, PTCH1, PTEN, RAD50, RAD51C, RAD51D, RB1, RECQL4, RET, RUNX1, SDHA, SDHAF2, SDHB, SDHC, SDHD, SMAD4, SMARCA4, SMARCB1, SMARCE1, STK11, SUFU, TERC, TERT, TMEM127, TP53, TSC1, TSC2, VHL, WRN, WT1).   . Results should be available in approximately 2-4 weeks, at which point we will contact him and address implications for him as well as address genetic testing for at-risk family members, if needed.      Dr. Jana Hakim was available for questions concerning  this case. Total time spent by me in face-to-face counseling was approximately 35 minutes.   _____________________________________________________________________   History of Present Illness: Mr. David Contreras, a 42 y.o. male, is being seen at the [facility] due to a personal history of multiple meningiomas. He presents to clinic today to discuss the possibility of a hereditary predisposition to cancer and discuss whether genetic testing is warranted.  Mr. [name] has a history of meningiomas identified in his early 21s. He reported that in 2003, he had evaluation of a bony growth on his forehead that lead to a CT scan as well as an MRI. 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   He then had follow-up imaging every 2 years (2005, 2007 and 2009) which did not show enlargement of any of the meningiomas. He did not have another MRI until 2019, at which point it was noted that 2 of the meningiomas had enlarged and were now appearing fused. He is scheduled for surgery later this month.   Past Medical History:  Diagnosis Date  . Brain tumor ([place])   . Esophageal stricture   . Meningioma ([place])   . [name], multiple ([place]) 2003    Past Surgical History:  Procedure Laterality Date  . ANKLE SURGERY    . BALLOON DILATION N/A 05/03/2016   Procedure: BALLOON DILATION;  Surgeon: Wilford Corner, MD;  Location: [facility] ENDOSCOPY;  Service: Endoscopy;  Laterality: N/A;  . ESOPHAGOGASTRODUODENOSCOPY N/A 04/26/2016   Procedure: ESOPHAGOGASTRODUODENOSCOPY (EGD);  Surgeon: Wilford Corner, MD;  Location: [facility] ENDOSCOPY;  Service: Endoscopy;  Laterality: N/A;  . ESOPHAGOGASTRODUODENOSCOPY (  EGD) WITH PROPOFOL N/A 05/03/2016   Procedure: ESOPHAGOGASTRODUODENOSCOPY (EGD) WITH PROPOFOL;  Surgeon: Wilford Corner, MD;  Location: [facility] ENDOSCOPY;  Service: Endoscopy;  Laterality: N/A;    Social History   Socioeconomic History  . Marital  status: Single    Spouse name: Not on file  . Number of children: Not on file  . Years of education: Not on file  . Highest education level: Not on file  Social Needs  . Financial resource strain: Not on file  . Food insecurity - worry: Not on file  . Food insecurity - inability: Not on file  . Transportation needs - medical: Not on file  . Transportation needs - non-medical: Not on file  Occupational History  . Not on file  Tobacco Use  . Smoking status: Never Smoker  . Smokeless tobacco: Never Used  Substance and Sexual Activity  . Alcohol use: Yes  . Drug use: No  . Sexual activity: Not on file  Other Topics Concern  . Not on file  Social History Narrative  . Not on file     Family History:  During the visit, a 4-generation pedigree was obtained. Family tree will be scanned in the Media tab in Epic  Significant diagnoses include the following:  Family History  Problem Relation Age of Onset  . Pancreatic cancer Paternal Grandmother 54       deceased 75    Additionally, Mr. Carrera has no children. He has one brother (age 46) who has a son (age 58). His mother (age 32) has two brothers (ages 52 and 40). His father (age 42) is an only child.  Mr. Guardado ancestry is Caucasian - NOS. There is no known Jewish ancestry and no consanguinity.  Discussion: We reviewed the characteristics, features and inheritance patterns of hereditary cancer syndromes and hereditary syndromes that predispose individuals to developing CNS tumors, including meningiomas. We discussed his risk of harboring a mutation in the context of his personal and family history. We discussed the high de novo rate with some of the genes and why we sometimes do not see any significant family history. We discussed the process of genetic testing, insurance coverage and implications of results: positive, negative and variant of unknown significance (VUS).    Mr. Timothy' questions were answered to his  satisfaction today and he is welcome to call with any additional questions or concerns. Thank you for the referral and allowing [place] to share in the care of your patient.    Steele Berg, MS, [place] Certified Genetic Counselor phone: [phone] [email or web address]

## 2017-05-23 NOTE — H&P (Signed)
Patient ID:   [id number] Patient: David Contreras  Date of Birth: [birth year] Visit Type: Patient Communication   Date: 04/22/2017 02:00 PM Provider: Marchia Meiers. Vertell Limber MD   This 42 year old male presents for MRI review.   History of Present Illness: 1.  MRI review  Patient returns to review his MRI   I reviewed the patient's MRI with him and we had also reviewed this in our coordinated multidisciplinary Brain tumor Clinic.  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  I have recommended removing the largest left frontal and also the left temporal meningiomas to decrease local mass effect and then we will follow the additional meningiomas.  We also removed osteoma is as possible at this 1st craniotomy.  We are getting a genetics consult.  We will obtain a BrainLAB CT scan to merged with the MRI for perioperative planning.  Patient is going to figure out his schedule and then let me know when he wishes to proceed.         Medical/Surgical/Interim History Reviewed, no change.  Last detailed document date:03/27/2017.     PAST MEDICAL HISTORY, SURGICAL HISTORY, FAMILY HISTORY, SOCIAL HISTORY AND REVIEW OF SYSTEMS I have reviewed the patient's past medical, surgical, family and social history as well as the comprehensive review of systems as included on the [facility] history form dated 03/27/2017, which I have signed.  Family History: Reviewed, no changes.  Last detailed document date:03/27/2017.   Social History: Reviewed, no changes. Last detailed document date: 03/27/2017.    MEDICATIONS(added, continued or stopped this visit):   ALLERGIES: Ingredient Reaction Medication Name Comment  NO KNOWN ALLERGIES     No known allergies. Reviewed, no changes.    Vitals Date Temp F  BP Pulse Ht In Wt Lb BMI BSA Pain Score  04/22/2017  142/92 80 71 240 33.47  0/10      IMPRESSION Multiple meningiomas with brain edema.  The tumors have been enlarging.  I have recommended craniotomy for tumor resection and he has asked me to remove osteoma is where possible.   Completed Orders (this encounter) Order Details Reason Side Interpretation Result Initial Treatment Date Region  Hypertension education Continue to monitor blood pressure. If blood pressure remains elevated contact primary care provider        Dietary management education, guidance, and counseling patient encouraged to eat a well balanced diet         Assessment/Plan # Detail Type Description   1. Assessment Meningioma (D32.9).       2. 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       3. Assessment Elevated blood-pressure reading, w/o diagnosis of htn (R03.0).       4. Assessment Body mass index (BMI) 33.0-33.9, adult (K93.81).   Plan Orders Today's instructions / counseling include(s) Dietary management education, guidance, and counseling.           Pain Management Plan Pain Scale: 0/10. Method: Numeric Pain Intensity Scale. Onset: 03/27/1997.  Proceed with BrainLAB CT scan and craniotomy for meningiomas.   Orders: Diagnostic Procedures: Assessment Procedure  D32.9 CT Head/Brain With & W/o Contrast brain lab protocol  Instruction(s)/Education: Assessment Instruction  R03.0 Hypertension education  [phone] Dietary management education, guidance, and counseling             Provider:  Vertell Limber MD, Marchia Meiers  04/27/2017 3:57 PM  Dictation edited by: Marchia Meiers. Vertell Limber    CC Providers: Helane Rima [place],  [place]  [id number]   Dashawn Bartnick MD  [address] [place], [place] [address]              Electronically signed by Marchia Meiers. Vertell Limber MD on 04/27/2017 03:57 PM  Patient ID:   [id number] Patient: David Contreras  Date of Birth: [birth year] Visit  Type: Office Visit   Date: 03/27/2017 03:15 PM Provider: Marchia Meiers. Vertell Limber MD   This 42 year old male presents for meningiomas.   History of Present Illness: 1.  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  He recalls brain MRIs were performed every 5 years, with most recent 2009.  He reports no problems.  Discovery of an osteoma 20+ years ago prompted additional studies which revealed numerous meningiomas.   History:  Healthy Surgical history:  Left ankle staph infection 1993  The patient has for cranial osteoma is and 8 intracranial meningiomas.  These have been stable over time with serial imaging.  I have not seen him for 9 years.   I have reviewed old photographs of him and compared them with current photographs as well as examining his skull and I do not believe there has been significant change in his appearance.  He is currently without significant symptoms or headaches but is concerned that the meningiomas may be growing.  He has not developed any difficulty with his speech which was something I advised him to watch out for because of the left temporal meningioma which may be causing de formation of the speech areas.           PAST MEDICAL/SURGICAL HISTORY   (Detailed) Patient reported no relevant past medical/surgical history.      PAST MEDICAL HISTORY, SURGICAL HISTORY, FAMILY HISTORY, SOCIAL HISTORY AND REVIEW OF SYSTEMS I have reviewed the patient's past medical, surgical, family and social history as well as the comprehensive review of systems as included on the [facility] history form dated 03/27/2017, which I have signed.  Family History  (Detailed) Patient reports there is no relevant family history.     Social History:  (Detailed) Tobacco use reviewed. Preferred language is Unknown.   Tobacco use status: Current  non-smoker. Smoking status: Never smoker.  SMOKING STATUS Type Smoking Status Usage Per Day Years Used Total Pack Years   Never smoker          MEDICATIONS(added, continued or stopped this visit):   ALLERGIES: Ingredient Reaction Medication Name Comment  NO KNOWN ALLERGIES     No known allergies.   Review of Systems System Neg/Pos Details  Constitutional Negative Chills, Fatigue, Fever, Malaise, Night sweats, Weight gain and Weight loss.  ENMT Negative Ear drainage, Hearing loss, Nasal drainage, Otalgia, Sinus pressure and Sore throat.  Eyes Negative Eye discharge, Eye pain and Vision changes.  Respiratory Negative Chronic cough, Cough, Dyspnea, Known TB exposure and Wheezing.  Cardio Negative Chest pain, Claudication, Edema and Irregular heartbeat/palpitations.  GI Negative Abdominal pain, Blood in stool, Change in stool pattern, Constipation, Decreased appetite, Diarrhea, Heartburn, Nausea and Vomiting.  GU Negative Dribbling, Dysuria, Erectile dysfunction, Hematuria, Polyuria (Genitourinary), Slow stream, Urinary frequency, Urinary incontinence and Urinary retention.  Endocrine Negative Cold intolerance, Heat intolerance, Polydipsia and Polyphagia.  Neuro Negative Dizziness, Extremity weakness, Gait disturbance, Headache, Memory impairment, Numbness in  extremity, Seizures and Tremors.  Psych Negative Anxiety, Depression and Insomnia.  Integumentary Negative Brittle hair, Brittle nails, Change in shape/size of mole(s), Hair loss, Hirsutism, Hives, Pruritus, Rash and Skin lesion.  MS Negative Back pain, Joint pain, Joint swelling, Muscle weakness and Neck pain.  Hema/Lymph Negative Easy bleeding, Easy bruising and Lymphadenopathy.  Allergic/Immuno Negative Contact allergy, Environmental allergies, Food allergies and Seasonal allergies.  Reproductive Negative Penile discharge and Sexual dysfunction.   Vitals Date Temp F BP Pulse Ht In Wt Lb BMI BSA Pain Score  03/27/2017   130/79 80 71 241 33.61  0/10     PHYSICAL EXAM General Level of Distress: no acute distress Overall Appearance: normal  Head and Face  Right Left  Fundoscopic Exam:  normal normal    Cardiovascular Cardiac: regular rate and rhythm without murmur  Right Left  Carotid Pulses: normal normal  Respiratory Lungs: clear to auscultation  Neurological Orientation: normal Recent and Remote Memory: normal Attention Span and Concentration:   normal Language: normal Fund of Knowledge: normal  Right Left Sensation: normal normal Upper Extremity Coordination: normal normal  Lower Extremity Coordination: normal normal  Musculoskeletal Gait and Station: normal  Right Left Upper Extremity Muscle Strength: normal normal Lower Extremity Muscle Strength: normal normal Upper Extremity Muscle Tone:  normal normal Lower Extremity Muscle Tone: normal normal   Motor Strength Upper and lower extremity motor strength was tested in the clinically pertinent muscles.     Deep Tendon Reflexes  Right Left Biceps: normal normal Triceps: normal normal Brachioradialis: normal normal Patellar: normal normal Achilles: normal normal  Cranial Nerves II. Optic Nerve/Visual Fields: normal III. Oculomotor: normal IV. Trochlear: normal V. Trigeminal: normal VI. Abducens: normal VII. Facial: normal VIII. Acoustic/Vestibular: normal IX. Glossopharyngeal: normal X. Vagus: normal XI. Spinal Accessory: normal XII. Hypoglossal: normal  Motor and other Tests Lhermittes: negative Rhomberg: negative Pronator drift: absent     Right Left Hoffman's: normal normal Clonus: normal normal Babinski: normal normal   Additional Findings:  Frontal nasal osteoma with widening of the knees the on.  There is some bossing of the forehead and a significant parasagittal osteoma  as well.     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 If blood pressure remains elevated contact primary care provider        Dietary management education, guidance, and counseling patient encouraged to eat a well balanced diet         Assessment/Plan # Detail Type Description   1. 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       2. Assessment Meningioma (D32.9).       3. Assessment Elevated blood-pressure reading, w/o diagnosis of htn (R03.0).       4. Assessment Body mass index (BMI) 33.0-33.9, adult (Z60.10).   Plan Orders Today's instructions / counseling include(s) Dietary management education, guidance, and counseling.           Pain Management Plan Pain Scale: 0/10. Method: Numeric Pain Intensity Scale. Onset: 03/27/1997.  I plan on obtaining new MRI of the brain with and without gadolinium and reviewing his situation with our Neuro-Oncology multidisciplinary Clinic to come up with best practices schedule for repeated regular follow-up imaging and also to review whether there has been any change in these existing tumors.   Orders: Diagnostic Procedures: Assessment Procedure  D32.9 MRI Brain With & W/o Contrast  Instruction(s)/Education: Assessment Instruction  R03.0 Hypertension education  [phone] Dietary management education, guidance, and counseling             Provider:  Vertell Limber MD, Marchia Meiers 03/31/2017 3:47 PM  Dictation edited by: Marchia Meiers. Vertell Limber    CC Providers: Helane Rima [place],  [place]  [id number]-   Dorita Rowlands MD  [address] [place], [place] [address]              Electronically signed by Marchia Meiers. Vertell Limber MD on 03/31/2017 03:47 PM

## 2017-05-23 NOTE — Pre-Procedure Instructions (Signed)
FORTUNE BRANNIGAN  05/23/2017      [id number] [address], [place] [address] Phone: [phone] Fax: [phone]    Your procedure is scheduled on April 2nd, Tuesday   Report to [facility] Admitting at 5:30AM             (posted surgery time 7:30a - 10a)   Call this number if you have problems the morning of surgery:  [phone]   Remember:              4-5 days prior to surgery, STOP TAKING any Vitamins, Herbal Supplements, Anti=inflammatories, Blood thinners.   Do not eat food or drink liquids after midnight, Monday.   Take these medicines the morning of surgery with A SIP OF WATER : NOTHING   Do not wear jewelry - no rings or watches.  Do not wear lotions, colognes, or deodorant.             Men may shave face and neck.   Do not bring valuables to the hospital.  [facility] is not responsible for any belongings or valuables.  Contacts, dentures or bridgework may not be worn into surgery.  Leave your suitcase in the car.  After surgery it may be brought to your room.  For patients admitted to the hospital, discharge time will be determined by your treatment team.  Please read over the following fact sheets that you were given. Pain Booklet and Surgical Site Infection Prevention     [facility]- Preparing For Surgery  Before surgery, you can play an important role. Because skin is not sterile, your skin needs to be as free of germs as possible. You can reduce the number of germs on your skin by washing with CHG (chlorahexidine gluconate) Soap before surgery.  CHG is an antiseptic cleaner which kills germs and bonds with the skin to continue killing germs even after washing.  Please do not use if you have an allergy to CHG or antibacterial soaps. If your skin becomes reddened/irritated stop using the CHG.  Do not shave (including legs and underarms) for at least 48 hours prior to first CHG shower. It is  OK to shave your face.  Please follow these instructions carefully.   1. Shower the NIGHT BEFORE SURGERY and the MORNING OF SURGERY with CHG.   2. If you chose to wash your hair, wash your hair first as usual with your normal shampoo.  3. After you shampoo, rinse your hair and body thoroughly to remove the shampoo.  4. Use CHG as you would any other liquid soap. You can apply CHG directly to the skin and wash gently with a scrungie or a clean washcloth.   5. Apply the CHG Soap to your body ONLY FROM THE NECK DOWN.  Do not use on open wounds or open sores. Avoid contact with your eyes, ears, mouth and genitals (private parts). Wash Face and genitals (private parts)  with your normal soap.  6. Wash thoroughly, paying special attention to the area where your surgery will be performed.  7. Thoroughly rinse your body with warm water from the neck down.  8. DO NOT shower/wash with your normal soap after using and rinsing off the CHG Soap.  9. Pat yourself dry with a CLEAN TOWEL.  10. Wear CLEAN PAJAMAS to bed the night before surgery, wear comfortable clothes the morning of surgery  11. Place CLEAN SHEETS on your  bed the night of your first shower and DO NOT SLEEP WITH PETS.    Day of Surgery: Do not apply any deodorants/lotions. Please wear clean clothes to the hospital/surgery center.

## 2017-05-24 ENCOUNTER — Ambulatory Visit (HOSPITAL_COMMUNITY)
Admission: RE | Admit: 2017-05-24 | Discharge: 2017-05-24 | Disposition: A | Discharge status: Home / Self Care | Payer: 59 | Source: Ambulatory Visit | Attending: Neurosurgery | Admitting: Neurosurgery

## 2017-05-24 ENCOUNTER — Other Ambulatory Visit: Payer: Self-pay

## 2017-05-24 ENCOUNTER — Encounter (HOSPITAL_COMMUNITY)
Admission: RE | Admit: 2017-05-24 | Discharge: 2017-05-24 | Disposition: A | Discharge status: Home / Self Care | Payer: 59 | Source: Ambulatory Visit | Attending: Neurosurgery | Admitting: Neurosurgery

## 2017-05-24 ENCOUNTER — Encounter (HOSPITAL_COMMUNITY): Payer: Self-pay

## 2017-05-24 DIAGNOSIS — D169 Benign neoplasm of bone and articular cartilage, unspecified: Secondary | ICD-10-CM | POA: Diagnosis not present

## 2017-05-24 DIAGNOSIS — D329 Benign neoplasm of meninges, unspecified: Secondary | ICD-10-CM | POA: Insufficient documentation

## 2017-05-24 HISTORY — DX: Headache, unspecified: R51.9

## 2017-05-24 HISTORY — DX: Headache: R51

## 2017-05-24 HISTORY — DX: Gastro-esophageal reflux disease without esophagitis: K21.9

## 2017-05-24 LAB — TYPE AND SCREEN
ABO/RH(D): A POS
Antibody Screen: NEGATIVE

## 2017-05-24 LAB — BASIC METABOLIC PANEL
Anion gap: 9 (ref 5–15)
BUN: 20 mg/dL (ref 6–20)
CALCIUM: 9.3 mg/dL (ref 8.9–10.3)
CO2: 23 mmol/L (ref 22–32)
CREATININE: 0.79 mg/dL (ref 0.61–1.24)
Chloride: 106 mmol/L (ref 101–111)
GFR calc Af Amer: >60 mL/min (ref >60)
GLUCOSE: 126 mg/dL — AB (ref 65–99)
Potassium: 4.3 mmol/L (ref 3.5–5.1)
Sodium: 138 mmol/L (ref 135–145)

## 2017-05-24 LAB — ABO/RH: ABO/RH(D): A POS

## 2017-05-24 LAB — CBC
HEMATOCRIT: 45.5 % (ref 39.0–52.0)
Hemoglobin: 15.6 g/dL (ref 13.0–17.0)
MCH: 30.2 pg (ref 26.0–34.0)
MCHC: 34.3 g/dL (ref 30.0–36.0)
MCV: 88 fL (ref 78.0–100.0)
PLATELETS: 198 10*3/uL (ref 150–400)
RBC: 5.17 MIL/uL (ref 4.22–5.81)
RDW: 12.3 % (ref 11.5–15.5)
WBC: 5.3 10*3/uL (ref 4.0–10.5)

## 2017-05-24 MED ORDER — IOPAMIDOL (ISOVUE-300) INJECTION 61%
INTRAVENOUS | Status: AC
  Administered 2017-05-24: 100 mL
  Filled 2017-05-24: qty 100

## 2017-05-29 ENCOUNTER — Ambulatory Visit (HOSPITAL_COMMUNITY): Payer: 59

## 2017-05-30 ENCOUNTER — Encounter: Payer: Self-pay | Admitting: Genetic Counselor

## 2017-05-30 DIAGNOSIS — Z1379 Encounter for other screening for genetic and chromosomal anomalies: Secondary | ICD-10-CM | POA: Insufficient documentation

## 2017-05-30 NOTE — Progress Notes (Signed)
Cancer Genetics Clinic       Genetic Test Results    Patient Name: David Contreras Patient DOB: 03/16/1975 Patient Age: 42 y.o. Encounter Date: 05/31/2017  Referring Provider: Erline Levine, MD  Primary Care Provider: Helane Rima, MD   Mr. Ledyard was called today to discuss genetic test results. Please see the Genetics note from his visit on 05/03/2017 for a detailed discussion of his personal and family history.  Genetic Testing: At the time of Mr. Mincey' visit, he decided to pursue genetic testing of multiple genes associated with hereditary susceptibility to cancer. Testing included sequencing and deletion/duplication analysis. Testing did not reveal a pathogenic mutation in any of the genes analyzed.  A copy of the genetic test report will be scanned into Epic under the Media tab.  The genes analyzed were the 83 genes on [facility]'s Multi-Cancer panel (ALK, APC, ATM, AXIN2, BAP1, BARD1, BLM, BMPR1A, BRCA1, BRCA2, BRIP1, CASR, CDC73, CDH1, CDK4, CDKN1B, CDKN1C, CDKN2A, CEBPA, CHEK2, CTNNA1, DICER1, DIS3L2, EGFR, EPCAM, FH, FLCN, GATA2, GPC3, GREM1, HOXB13, HRAS, KIT, MAX, MEN1, MET, MITF, MLH1, MSH2, MSH3, MSH6, MUTYH, NBN, NF1, NF2, NTHL1, PALB2, PDGFRA, PHOX2B, PMS2, POLD1, POLE, POT1, PRKAR1A, PTCH1, PTEN, RAD50, RAD51C, RAD51D, RB1, RECQL4, RET, RUNX1, SDHA, SDHAF2, SDHB, SDHC, SDHD, SMAD4, SMARCA4, SMARCB1, SMARCE1, STK11, SUFU, TERC, TERT, TMEM127, TP53, TSC1, TSC2, VHL, WRN, WT1).  Since the current test is not perfect, it is possible that there may be a gene mutation that current testing cannot detect, but that chance is small. It is possible that a different genetic factor, which has not yet been discovered or is not on this panel, is responsible for the cancer diagnoses in the family. Again, the likelihood of this is low. No additional testing is recommended at this time for Mr. Mancel Bale.  A Variant of Uncertain Significance was detected: MAX c.[id number]>A  (p.[id number]). This is still considered a normal result. While at this time, it is unknown if this finding is associated with increased cancer risk, the majority of these variants get reclassified to be inconsequential. Medical management should not be based on this finding. With time, we suspect the lab will determine the significance, if any. If we do learn more about it, we will try to contact Mr. Wiswell to discuss it further. It is important to stay in touch with [place] periodically and keep the address and phone number up to date.  Cancer Screening: These results suggest that Mr. Elsass' meningiomas were most likely not due to an inherited predisposition. Most cancers and tumors happen by chance and this test, along with details of his family history, suggests that his history of meningiomas falls into this category. He is recommended to follow the cancer screening guidelines provided by his physician.   Family Members: Any relative who had cancer at a young age or had a particularly rare cancer may also wish to pursue genetic testing. Genetic counselors can be located in other cities, by visiting the website of the [employer] (Artist[employer].se) and Field seismologist for a Dietitian by zip code.   Family members are not recommended to get tested for the above VUS outside of a research protocol as this finding has no implications for their medical management.  Lastly, cancer genetics is a rapidly advancing field and it is possible that new genetic tests will be appropriate for Mr. Riedl in the future. We encourage him to remain in  contact with [place] on an annual basis so we can update his personal and family histories, and let him know of advances in cancer genetics that may benefit the family. Our contact number was provided. Mr. Mallon is welcome to call anytime with additional questions.     Steele Berg, MS, [place] Certified Genetic Counselor phone: [phone]

## 2017-05-31 ENCOUNTER — Ambulatory Visit: Payer: Self-pay | Admitting: Genetic Counselor

## 2017-05-31 DIAGNOSIS — Z1379 Encounter for other screening for genetic and chromosomal anomalies: Secondary | ICD-10-CM

## 2017-06-03 NOTE — Anesthesia Preprocedure Evaluation (Addendum)
Anesthesia Evaluation  Patient identified by MRN, date of birth, ID band Patient awake    Reviewed: Allergy & Precautions, NPO status , Patient's Chart, lab work & pertinent test results  History of Anesthesia Complications Negative for: history of anesthetic complications  Airway Mallampati: II  TM Distance: >3 FB Neck ROM: Full    Dental  (+) Dental Advisory Given, Teeth Intact   Pulmonary neg pulmonary ROS,    breath sounds clear to auscultation       Cardiovascular negative cardio ROS   Rhythm:Regular Rate:Normal     Neuro/Psych  Headaches, meningioma    GI/Hepatic Neg liver ROS, GERD  Medicated and Controlled,  Endo/Other  Morbid obesity  Renal/GU negative Renal ROS     Musculoskeletal   Abdominal (+) + obese,   Peds  Hematology negative hematology ROS (+)   Anesthesia Other Findings   Reproductive/Obstetrics                            Anesthesia Physical Anesthesia Plan  ASA: II  Anesthesia Plan: General   Post-op Pain Management:    Induction: Intravenous  PONV Risk Score and Plan: 2 and Ondansetron and Dexamethasone  Airway Management Planned: Oral ETT  Additional Equipment: Arterial line  Intra-op Plan:   Post-operative Plan: Extubation in OR  Informed Consent: I have reviewed the patients History and Physical, chart, labs and discussed the procedure including the risks, benefits and alternatives for the proposed anesthesia with the patient or authorized representative who has indicated his/her understanding and acceptance.   Dental advisory given  Plan Discussed with: CRNA and Surgeon  Anesthesia Plan Comments: (Plan routine monitors, A line, GETA)        Anesthesia Quick Evaluation

## 2017-06-04 ENCOUNTER — Inpatient Hospital Stay (HOSPITAL_COMMUNITY): Payer: No Typology Code available for payment source | Admitting: Certified Registered Nurse Anesthetist

## 2017-06-04 ENCOUNTER — Inpatient Hospital Stay (HOSPITAL_COMMUNITY)
Admission: RE | Admit: 2017-06-04 | Discharge: 2017-06-06 | DRG: 025 | Disposition: A | Discharge status: Home / Self Care | Payer: No Typology Code available for payment source | Source: Ambulatory Visit | Attending: Neurosurgery | Admitting: Neurosurgery

## 2017-06-04 ENCOUNTER — Encounter (HOSPITAL_COMMUNITY): Payer: Self-pay | Admitting: Urology

## 2017-06-04 ENCOUNTER — Inpatient Hospital Stay (HOSPITAL_COMMUNITY)
Admission: RE | Disposition: A | Discharge status: Home / Self Care | Payer: Self-pay | Source: Ambulatory Visit | Attending: Neurosurgery

## 2017-06-04 DIAGNOSIS — I10 Essential (primary) hypertension: Secondary | ICD-10-CM | POA: Diagnosis present

## 2017-06-04 DIAGNOSIS — D320 Benign neoplasm of cerebral meninges: Principal | ICD-10-CM | POA: Diagnosis present

## 2017-06-04 DIAGNOSIS — D496 Neoplasm of unspecified behavior of brain: Secondary | ICD-10-CM | POA: Diagnosis present

## 2017-06-04 DIAGNOSIS — G936 Cerebral edema: Secondary | ICD-10-CM | POA: Diagnosis present

## 2017-06-04 HISTORY — PX: CRANIOTOMY: SHX93

## 2017-06-04 HISTORY — PX: APPLICATION OF CRANIAL NAVIGATION: SHX6578

## 2017-06-04 LAB — MRSA PCR SCREENING: MRSA by PCR: NEGATIVE

## 2017-06-04 SURGERY — CRANIOTOMY TUMOR EXCISION
Anesthesia: General

## 2017-06-04 MED ORDER — PROPOFOL 1000 MG/100ML IV EMUL
INTRAVENOUS | Status: AC
  Filled 2017-06-04: qty 100

## 2017-06-04 MED ORDER — PROPOFOL 10 MG/ML IV BOLUS
INTRAVENOUS | Status: DC | PRN
  Administered 2017-06-04: 60 mg via INTRAVENOUS
  Administered 2017-06-04: 200 mg via INTRAVENOUS
  Administered 2017-06-04: 100 mg via INTRAVENOUS

## 2017-06-04 MED ORDER — EPHEDRINE SULFATE 50 MG/ML IJ SOLN
INTRAMUSCULAR | Status: DC | PRN
  Administered 2017-06-04: 5 mg via INTRAVENOUS
  Administered 2017-06-04 (×5): 10 mg via INTRAVENOUS
  Administered 2017-06-04: 5 mg via INTRAVENOUS
  Administered 2017-06-04 (×3): 10 mg via INTRAVENOUS

## 2017-06-04 MED ORDER — THROMBIN 20000 UNITS EX SOLR
CUTANEOUS | Status: AC
  Filled 2017-06-04: qty 20000

## 2017-06-04 MED ORDER — BUPIVACAINE HCL (PF) 0.5 % IJ SOLN
INTRAMUSCULAR | Status: AC
  Filled 2017-06-04: qty 30

## 2017-06-04 MED ORDER — BISACODYL 10 MG RE SUPP: 10.0000 mg | Freq: Every day | RECTAL | Status: DC | PRN

## 2017-06-04 MED ORDER — MIDAZOLAM HCL 2 MG/2ML IJ SOLN: 0.5000 mg | Freq: Once | INTRAMUSCULAR | Status: DC | PRN

## 2017-06-04 MED ORDER — PHENYLEPHRINE 40 MCG/ML (10ML) SYRINGE FOR IV PUSH (FOR BLOOD PRESSURE SUPPORT)
PREFILLED_SYRINGE | INTRAVENOUS | Status: AC
  Filled 2017-06-04: qty 10

## 2017-06-04 MED ORDER — LABETALOL HCL 5 MG/ML IV SOLN
INTRAVENOUS | Status: DC | PRN
  Administered 2017-06-04: 5 mg via INTRAVENOUS

## 2017-06-04 MED ORDER — THROMBIN 5000 UNITS EX SOLR
CUTANEOUS | Status: AC
  Filled 2017-06-04: qty 5000

## 2017-06-04 MED ORDER — ADULT MULTIVITAMIN W/MINERALS CH
1.0000 | ORAL_TABLET | Freq: Every day | ORAL | Status: DC
  Administered 2017-06-04 – 2017-06-05 (×2): 1 via ORAL
  Filled 2017-06-04 (×2): qty 1

## 2017-06-04 MED ORDER — LIDOCAINE HCL (CARDIAC) 20 MG/ML IV SOLN
INTRAVENOUS | Status: DC | PRN
  Administered 2017-06-04: 100 mg via INTRAVENOUS
  Administered 2017-06-04: 40 mg via INTRAVENOUS

## 2017-06-04 MED ORDER — HEMOSTATIC AGENTS (NO CHARGE) OPTIME
TOPICAL | Status: DC | PRN
  Administered 2017-06-04: 1 via TOPICAL

## 2017-06-04 MED ORDER — MICROFIBRILLAR COLL HEMOSTAT EX PADS: MEDICATED_PAD | CUTANEOUS | Status: DC | PRN

## 2017-06-04 MED ORDER — ROCURONIUM BROMIDE 100 MG/10ML IV SOLN
INTRAVENOUS | Status: DC | PRN
  Administered 2017-06-04 (×3): 50 mg via INTRAVENOUS
  Administered 2017-06-04 (×2): 20 mg via INTRAVENOUS

## 2017-06-04 MED ORDER — CEFAZOLIN SODIUM-DEXTROSE 2-4 GM/100ML-% IV SOLN
2.0000 g | Freq: Three times a day (TID) | INTRAVENOUS | Status: AC
  Administered 2017-06-04 – 2017-06-05 (×2): 2 g via INTRAVENOUS
  Filled 2017-06-04 (×2): qty 100

## 2017-06-04 MED ORDER — DEXAMETHASONE SODIUM PHOSPHATE 10 MG/ML IJ SOLN
6.0000 mg | Freq: Four times a day (QID) | INTRAMUSCULAR | Status: AC
  Administered 2017-06-04 – 2017-06-05 (×4): 6 mg via INTRAVENOUS
  Filled 2017-06-04 (×4): qty 1

## 2017-06-04 MED ORDER — LABETALOL HCL 5 MG/ML IV SOLN: 10.0000 mg | INTRAVENOUS | Status: DC | PRN

## 2017-06-04 MED ORDER — ROCURONIUM BROMIDE 10 MG/ML (PF) SYRINGE
PREFILLED_SYRINGE | INTRAVENOUS | Status: AC
  Filled 2017-06-04: qty 5

## 2017-06-04 MED ORDER — POLYETHYLENE GLYCOL 3350 17 G PO PACK: 17.0000 g | PACK | Freq: Every day | ORAL | Status: DC | PRN

## 2017-06-04 MED ORDER — DEXAMETHASONE SODIUM PHOSPHATE 10 MG/ML IJ SOLN
INTRAMUSCULAR | Status: DC | PRN
  Administered 2017-06-04: 10 mg via INTRAVENOUS

## 2017-06-04 MED ORDER — LABETALOL HCL 5 MG/ML IV SOLN
INTRAVENOUS | Status: AC
  Filled 2017-06-04: qty 4

## 2017-06-04 MED ORDER — LIDOCAINE-EPINEPHRINE 1 %-1:100000 IJ SOLN
INTRAMUSCULAR | Status: DC | PRN
  Administered 2017-06-04: 24 mL

## 2017-06-04 MED ORDER — PANTOPRAZOLE SODIUM 40 MG IV SOLR
40.0000 mg | Freq: Every day | INTRAVENOUS | Status: DC
  Administered 2017-06-04 – 2017-06-05 (×2): 40 mg via INTRAVENOUS
  Filled 2017-06-04 (×2): qty 40

## 2017-06-04 MED ORDER — FENTANYL CITRATE (PF) 250 MCG/5ML IJ SOLN
INTRAMUSCULAR | Status: AC
  Filled 2017-06-04: qty 5

## 2017-06-04 MED ORDER — SODIUM CHLORIDE 0.9 % IV SOLN
INTRAVENOUS | Status: DC | PRN
  Administered 2017-06-04 (×3): via INTRAVENOUS

## 2017-06-04 MED ORDER — LIDOCAINE-EPINEPHRINE 1 %-1:100000 IJ SOLN
INTRAMUSCULAR | Status: AC
  Filled 2017-06-04: qty 1

## 2017-06-04 MED ORDER — HYDROCODONE-ACETAMINOPHEN 5-325 MG PO TABS
1.0000 | ORAL_TABLET | ORAL | Status: DC | PRN
  Administered 2017-06-04 – 2017-06-05 (×6): 1 via ORAL
  Filled 2017-06-04 (×6): qty 1

## 2017-06-04 MED ORDER — PROMETHAZINE HCL 25 MG/ML IJ SOLN: 6.2500 mg | INTRAMUSCULAR | Status: DC | PRN

## 2017-06-04 MED ORDER — PHENYLEPHRINE HCL 10 MG/ML IJ SOLN
INTRAVENOUS | Status: DC | PRN
  Administered 2017-06-04: 40 ug/min via INTRAVENOUS

## 2017-06-04 MED ORDER — CHLORHEXIDINE GLUCONATE CLOTH 2 % EX PADS: 6.0000 | MEDICATED_PAD | Freq: Once | CUTANEOUS | Status: DC

## 2017-06-04 MED ORDER — SODIUM CHLORIDE 0.9 % IV SOLN
0.0125 ug/kg/min | INTRAVENOUS | Status: DC
  Administered 2017-06-04: 10:00:00 via INTRAVENOUS
  Administered 2017-06-04: 0.15 ug/kg/min via INTRAVENOUS
  Filled 2017-06-04 (×3): qty 2000

## 2017-06-04 MED ORDER — SUGAMMADEX SODIUM 200 MG/2ML IV SOLN
INTRAVENOUS | Status: DC | PRN
  Administered 2017-06-04: 200 mg via INTRAVENOUS

## 2017-06-04 MED ORDER — LEVETIRACETAM IN NACL 500 MG/100ML IV SOLN
500.0000 mg | Freq: Two times a day (BID) | INTRAVENOUS | Status: DC
  Administered 2017-06-04 – 2017-06-06 (×4): 500 mg via INTRAVENOUS
  Filled 2017-06-04 (×5): qty 100

## 2017-06-04 MED ORDER — EPHEDRINE 5 MG/ML INJ
INTRAVENOUS | Status: AC
  Filled 2017-06-04: qty 20

## 2017-06-04 MED ORDER — OMEPRAZOLE MAGNESIUM 20 MG PO TBEC: 20.0000 mg | DELAYED_RELEASE_TABLET | Freq: Every day | ORAL | Status: DC

## 2017-06-04 MED ORDER — CEFAZOLIN SODIUM-DEXTROSE 2-4 GM/100ML-% IV SOLN
INTRAVENOUS | Status: AC
  Filled 2017-06-04: qty 100

## 2017-06-04 MED ORDER — ARTIFICIAL TEARS OPHTHALMIC OINT
TOPICAL_OINTMENT | OPHTHALMIC | Status: DC | PRN
  Administered 2017-06-04: 1 via OPHTHALMIC

## 2017-06-04 MED ORDER — ONDANSETRON HCL 4 MG/2ML IJ SOLN: 4.0000 mg | INTRAMUSCULAR | Status: DC | PRN

## 2017-06-04 MED ORDER — EPHEDRINE 5 MG/ML INJ
INTRAVENOUS | Status: AC
  Filled 2017-06-04: qty 10

## 2017-06-04 MED ORDER — SODIUM CHLORIDE 0.9 % IV SOLN
0.0500 ug/kg/min | INTRAVENOUS | Status: DC
  Filled 2017-06-04 (×3): qty 5000

## 2017-06-04 MED ORDER — 0.9 % SODIUM CHLORIDE (POUR BTL) OPTIME
TOPICAL | Status: DC | PRN
  Administered 2017-06-04 (×5): 1000 mL

## 2017-06-04 MED ORDER — CEFAZOLIN SODIUM-DEXTROSE 2-4 GM/100ML-% IV SOLN
2.0000 g | INTRAVENOUS | Status: AC
  Administered 2017-06-04: 2 g via INTRAVENOUS

## 2017-06-04 MED ORDER — PROPOFOL 10 MG/ML IV BOLUS
INTRAVENOUS | Status: AC
  Filled 2017-06-04: qty 40

## 2017-06-04 MED ORDER — MORPHINE SULFATE (PF) 4 MG/ML IV SOLN: 1.0000 mg | INTRAVENOUS | Status: DC | PRN

## 2017-06-04 MED ORDER — FENTANYL CITRATE (PF) 100 MCG/2ML IJ SOLN
INTRAMUSCULAR | Status: DC | PRN
  Administered 2017-06-04: 150 ug via INTRAVENOUS
  Administered 2017-06-04 (×2): 50 ug via INTRAVENOUS

## 2017-06-04 MED ORDER — ACETAMINOPHEN 325 MG PO TABS
650.0000 mg | ORAL_TABLET | ORAL | Status: DC | PRN
  Administered 2017-06-04: 650 mg via ORAL
  Filled 2017-06-04: qty 2

## 2017-06-04 MED ORDER — LIDOCAINE HCL (CARDIAC) 20 MG/ML IV SOLN
INTRAVENOUS | Status: AC
  Filled 2017-06-04: qty 5

## 2017-06-04 MED ORDER — MIDAZOLAM HCL 2 MG/2ML IJ SOLN
INTRAMUSCULAR | Status: AC
  Filled 2017-06-04: qty 2

## 2017-06-04 MED ORDER — THROMBIN (RECOMBINANT) 5000 UNITS EX SOLR
OROMUCOSAL | Status: DC | PRN
  Administered 2017-06-04 (×2): via TOPICAL

## 2017-06-04 MED ORDER — FENTANYL CITRATE (PF) 100 MCG/2ML IJ SOLN: 25.0000 ug | INTRAMUSCULAR | Status: DC | PRN

## 2017-06-04 MED ORDER — MEPERIDINE HCL 50 MG/ML IJ SOLN: 6.2500 mg | INTRAMUSCULAR | Status: DC | PRN

## 2017-06-04 MED ORDER — DEXAMETHASONE SODIUM PHOSPHATE 4 MG/ML IJ SOLN: 4.0000 mg | Freq: Three times a day (TID) | INTRAMUSCULAR | Status: DC

## 2017-06-04 MED ORDER — PROPOFOL 500 MG/50ML IV EMUL
INTRAVENOUS | Status: DC | PRN
  Administered 2017-06-04: 100 ug/kg/min via INTRAVENOUS

## 2017-06-04 MED ORDER — ALBUMIN HUMAN 5 % IV SOLN
INTRAVENOUS | Status: DC | PRN
  Administered 2017-06-04: 10:00:00 via INTRAVENOUS

## 2017-06-04 MED ORDER — LORATADINE 10 MG PO TABS
10.0000 mg | ORAL_TABLET | Freq: Every day | ORAL | Status: DC
  Administered 2017-06-04 – 2017-06-05 (×2): 10 mg via ORAL
  Filled 2017-06-04 (×2): qty 1

## 2017-06-04 MED ORDER — BACITRACIN ZINC 500 UNIT/GM EX OINT
TOPICAL_OINTMENT | CUTANEOUS | Status: DC | PRN
  Administered 2017-06-04: 1 via TOPICAL

## 2017-06-04 MED ORDER — ONDANSETRON HCL 4 MG/2ML IJ SOLN
INTRAMUSCULAR | Status: AC
  Filled 2017-06-04: qty 2

## 2017-06-04 MED ORDER — LACTATED RINGERS IV SOLN
INTRAVENOUS | Status: DC | PRN
  Administered 2017-06-04: 07:00:00 via INTRAVENOUS

## 2017-06-04 MED ORDER — DEXAMETHASONE SODIUM PHOSPHATE 4 MG/ML IJ SOLN
4.0000 mg | Freq: Four times a day (QID) | INTRAMUSCULAR | Status: AC
  Administered 2017-06-05 – 2017-06-06 (×4): 4 mg via INTRAVENOUS
  Filled 2017-06-04 (×4): qty 1

## 2017-06-04 MED ORDER — BACITRACIN ZINC 500 UNIT/GM EX OINT
TOPICAL_OINTMENT | CUTANEOUS | Status: AC
  Filled 2017-06-04: qty 28.35

## 2017-06-04 MED ORDER — ONDANSETRON HCL 4 MG PO TABS: 4.0000 mg | ORAL_TABLET | ORAL | Status: DC | PRN

## 2017-06-04 MED ORDER — FLEET ENEMA 7-19 GM/118ML RE ENEM
1.0000 | ENEMA | Freq: Once | RECTAL | Status: DC | PRN
Start: 2017-06-04 — End: 2017-06-06

## 2017-06-04 MED ORDER — ONDANSETRON HCL 4 MG/2ML IJ SOLN
INTRAMUSCULAR | Status: DC | PRN
  Administered 2017-06-04: 4 mg via INTRAVENOUS

## 2017-06-04 MED ORDER — POTASSIUM CHLORIDE IN NACL 20-0.9 MEQ/L-% IV SOLN
INTRAVENOUS | Status: DC
  Administered 2017-06-04 – 2017-06-05 (×4): via INTRAVENOUS
  Filled 2017-06-04 (×5): qty 1000

## 2017-06-04 MED ORDER — SODIUM CHLORIDE 0.9 % IV SOLN
INTRAVENOUS | Status: DC | PRN
  Administered 2017-06-04: 09:00:00 via INTRAVENOUS

## 2017-06-04 MED ORDER — BUPIVACAINE HCL (PF) 0.5 % IJ SOLN
INTRAMUSCULAR | Status: DC | PRN
  Administered 2017-06-04: 24 mL

## 2017-06-04 MED ORDER — DEXAMETHASONE SODIUM PHOSPHATE 10 MG/ML IJ SOLN
INTRAMUSCULAR | Status: AC
  Filled 2017-06-04: qty 1

## 2017-06-04 MED ORDER — ACETAMINOPHEN 650 MG RE SUPP: 650.0000 mg | RECTAL | Status: DC | PRN

## 2017-06-04 MED ORDER — DOCUSATE SODIUM 100 MG PO CAPS
100.0000 mg | ORAL_CAPSULE | Freq: Two times a day (BID) | ORAL | Status: DC
  Administered 2017-06-04 – 2017-06-05 (×3): 100 mg via ORAL
  Filled 2017-06-04 (×3): qty 1

## 2017-06-04 MED ORDER — LEVETIRACETAM IN NACL 500 MG/100ML IV SOLN
500.0000 mg | INTRAVENOUS | Status: AC
  Administered 2017-06-04: 500 mg via INTRAVENOUS
  Filled 2017-06-04: qty 100

## 2017-06-04 MED ORDER — PROMETHAZINE HCL 25 MG PO TABS: 12.5000 mg | ORAL_TABLET | ORAL | Status: DC | PRN

## 2017-06-04 MED ORDER — THROMBIN (RECOMBINANT) 20000 UNITS EX SOLR
CUTANEOUS | Status: DC | PRN
  Administered 2017-06-04: 07:00:00 via TOPICAL

## 2017-06-04 MED ORDER — PROPOFOL 10 MG/ML IV BOLUS
INTRAVENOUS | Status: AC
  Filled 2017-06-04: qty 20

## 2017-06-04 SURGICAL SUPPLY — 100 items
BATTERY IQ STERILE (MISCELLANEOUS) ×2 IMPLANT
BIT DRILL WIRE PASS 1.3MM (BIT) IMPLANT
BLADE CLIPPER SURG (BLADE) ×4 IMPLANT
BNDG CMPR 75X41 PLY ABS (GAUZE/BANDAGES/DRESSINGS) ×2
BNDG CMPR 75X41 PLY HI ABS (GAUZE/BANDAGES/DRESSINGS) ×4
BNDG GAUZE ELAST 4 BULKY (GAUZE/BANDAGES/DRESSINGS) ×3 IMPLANT
BNDG STRETCH 4X75 NS LF (GAUZE/BANDAGES/DRESSINGS) ×2 IMPLANT
BNDG STRETCH 4X75 STRL LF (GAUZE/BANDAGES/DRESSINGS) ×8 IMPLANT
BTRY SRG DRVR 1.5 IQ (MISCELLANEOUS) ×2
BUR ACORN 6.0 PRECISION (BURR) ×3 IMPLANT
BUR ACORN 6.0MM PRECISION (BURR) ×1
BUR CUTTER 7.0 ROUND (BURR) ×4 IMPLANT
BUR MATCHSTICK NEURO 3.0 LAGG (BURR) ×2 IMPLANT
BUR PRECISION FLUTE 5.0 (BURR) ×3 IMPLANT
BUR SPIRAL ROUTER 2.3 (BUR) ×7 IMPLANT
BUR SPIRAL ROUTER 2.3MM (BUR) ×3
BUR TAPERED ROUTER 3.0 (BURR) ×2 IMPLANT
CANISTER SUCT 3000ML PPV (MISCELLANEOUS) ×4 IMPLANT
CARTRIDGE OIL MAESTRO DRILL (MISCELLANEOUS) ×4 IMPLANT
CLIP RANEY DISP (INSTRUMENTS) ×4 IMPLANT
CLIP VESOCCLUDE MED 6/CT (CLIP) IMPLANT
CONT SPEC 4OZ CLIKSEAL STRL BL (MISCELLANEOUS) ×4 IMPLANT
COVER MAYO STAND STRL (DRAPES) IMPLANT
DECANTER SPIKE VIAL GLASS SM (MISCELLANEOUS) ×4 IMPLANT
DIFFUSER DRILL AIR PNEUMATIC (MISCELLANEOUS) ×8 IMPLANT
DRAPE MICROSCOPE LEICA (MISCELLANEOUS) ×3 IMPLANT
DRAPE NEUROLOGICAL W/INCISE (DRAPES) ×4 IMPLANT
DRAPE STERI IOBAN 125X83 (DRAPES) IMPLANT
DRAPE WARM FLUID 44X44 (DRAPE) ×7 IMPLANT
DRILL WIRE PASS 1.3MM (BIT) ×4
DRSG TELFA 3X8 NADH (GAUZE/BANDAGES/DRESSINGS) ×4 IMPLANT
DURAMATRIX ONLAY 2X2 (Neuro Prosthesis/Implant) ×2 IMPLANT
DURAMATRIX ONLAY 3X3 (Plate) ×3 IMPLANT
DURAPREP 6ML APPLICATOR 50/CS (WOUND CARE) ×8 IMPLANT
ELECT REM PT RETURN 9FT ADLT (ELECTROSURGICAL) ×4
ELECTRODE REM PT RTRN 9FT ADLT (ELECTROSURGICAL) ×2 IMPLANT
EVACUATOR 1/8 PVC DRAIN (DRAIN) IMPLANT
EVACUATOR SILICONE 100CC (DRAIN) IMPLANT
FORCEPS BIPOLAR SPETZLER 8 1.0 (NEUROSURGERY SUPPLIES) ×3 IMPLANT
GAUZE SPONGE 4X4 12PLY STRL (GAUZE/BANDAGES/DRESSINGS) ×4 IMPLANT
GAUZE SPONGE 4X4 16PLY XRAY LF (GAUZE/BANDAGES/DRESSINGS) ×3 IMPLANT
GLOVE BIO SURGEON STRL SZ8 (GLOVE) ×10 IMPLANT
GLOVE BIOGEL PI IND STRL 7.0 (GLOVE) IMPLANT
GLOVE BIOGEL PI IND STRL 7.5 (GLOVE) IMPLANT
GLOVE BIOGEL PI IND STRL 8 (GLOVE) ×2 IMPLANT
GLOVE BIOGEL PI IND STRL 8.5 (GLOVE) ×2 IMPLANT
GLOVE BIOGEL PI INDICATOR 7.0 (GLOVE) ×6
GLOVE BIOGEL PI INDICATOR 7.5 (GLOVE) ×4
GLOVE BIOGEL PI INDICATOR 8 (GLOVE) ×2
GLOVE BIOGEL PI INDICATOR 8.5 (GLOVE) ×2
GLOVE ECLIPSE 6.5 STRL STRAW (GLOVE) ×2 IMPLANT
GLOVE ECLIPSE 8.0 STRL XLNG CF (GLOVE) ×4 IMPLANT
GLOVE EXAM NITRILE LRG STRL (GLOVE) IMPLANT
GLOVE EXAM NITRILE XL STR (GLOVE) IMPLANT
GLOVE EXAM NITRILE XS STR PU (GLOVE) IMPLANT
GOWN STRL REUS W/ TWL LRG LVL3 (GOWN DISPOSABLE) IMPLANT
GOWN STRL REUS W/ TWL XL LVL3 (GOWN DISPOSABLE) IMPLANT
GOWN STRL REUS W/TWL 2XL LVL3 (GOWN DISPOSABLE) ×3 IMPLANT
GOWN STRL REUS W/TWL LRG LVL3 (GOWN DISPOSABLE) ×8
GOWN STRL REUS W/TWL XL LVL3 (GOWN DISPOSABLE) ×4
HEMOSTAT POWDER SURGIFOAM 1G (HEMOSTASIS) ×4 IMPLANT
HEMOSTAT SURGICEL 2X14 (HEMOSTASIS) ×4 IMPLANT
KIT BASIN OR (CUSTOM PROCEDURE TRAY) ×4 IMPLANT
KIT TURNOVER KIT B (KITS) ×4 IMPLANT
MARKER SKIN DUAL TIP RULER LAB (MISCELLANEOUS) ×8 IMPLANT
MARKER SPHERE PSV REFLC 13MM (MARKER) ×8 IMPLANT
NDL HYPO 25X1 1.5 SAFETY (NEEDLE) ×1 IMPLANT
NEEDLE HYPO 25X1 1.5 SAFETY (NEEDLE) ×4 IMPLANT
NS IRRIG 1000ML POUR BTL (IV SOLUTION) ×16 IMPLANT
OIL CARTRIDGE MAESTRO DRILL (MISCELLANEOUS) ×8
PACK CRANIOTOMY CUSTOM (CUSTOM PROCEDURE TRAY) ×4 IMPLANT
PAD ARMBOARD 7.5X6 YLW CONV (MISCELLANEOUS) ×4 IMPLANT
PAD DRESSING TELFA 3X8 NADH (GAUZE/BANDAGES/DRESSINGS) IMPLANT
PATTIES SURGICAL .25X.25 (GAUZE/BANDAGES/DRESSINGS) IMPLANT
PATTIES SURGICAL .5 X.5 (GAUZE/BANDAGES/DRESSINGS) IMPLANT
PATTIES SURGICAL .5 X3 (DISPOSABLE) ×2 IMPLANT
PATTIES SURGICAL 1/4 X 3 (GAUZE/BANDAGES/DRESSINGS) IMPLANT
PATTIES SURGICAL 1X1 (DISPOSABLE) ×4 IMPLANT
PIN MAYFIELD SKULL DISP (PIN) ×4 IMPLANT
PLATE 1.5 5HOLE SQUARE (Plate) ×8 IMPLANT
PLATE 1.5/0.2 85X50M HEX PNL (Plate) ×2 IMPLANT
RUBBERBAND STERILE (MISCELLANEOUS) ×8 IMPLANT
SCREW SELF DRILL HT 1.5/4MM (Screw) ×40 IMPLANT
SPECIMEN JAR SMALL (MISCELLANEOUS) IMPLANT
SPONGE NEURO XRAY DETECT 1X3 (DISPOSABLE) IMPLANT
SPONGE SURGIFOAM ABS GEL 100 (HEMOSTASIS) ×4 IMPLANT
STAPLER SKIN PROX WIDE 3.9 (STAPLE) ×4 IMPLANT
STAPLER VISISTAT 35W (STAPLE) ×4 IMPLANT
SUT ETHILON 3 0 FSL (SUTURE) IMPLANT
SUT NURALON 4 0 TR CR/8 (SUTURE) ×12 IMPLANT
SUT SILK 2 0 PERMA HAND 18 BK (SUTURE) ×3 IMPLANT
SUT VIC AB 2-0 CP2 18 (SUTURE) ×10 IMPLANT
SYR CONTROL 10ML LL (SYRINGE) ×4 IMPLANT
TOWEL GREEN STERILE (TOWEL DISPOSABLE) ×4 IMPLANT
TOWEL GREEN STERILE FF (TOWEL DISPOSABLE) ×4 IMPLANT
TRAY FOLEY W/METER SILVER 16FR (SET/KITS/TRAYS/PACK) ×4 IMPLANT
TUBE CONNECTING 12'X1/4 (SUCTIONS) ×1
TUBE CONNECTING 12X1/4 (SUCTIONS) ×3 IMPLANT
UNDERPAD 30X30 (UNDERPADS AND DIAPERS) ×2 IMPLANT
WATER STERILE IRR 1000ML POUR (IV SOLUTION) ×4 IMPLANT

## 2017-06-04 NOTE — Progress Notes (Signed)
Awake, alert, conversant.  MAEW.  PERRL, EOMI.  Speech clear and fluent.  Doing well.

## 2017-06-04 NOTE — Brief Op Note (Signed)
06/04/2017  11:15 AM  PATIENT:  David Contreras  42 y.o. male  PRE-OPERATIVE DIAGNOSIS:  [name] with multiple cranial osteomas  POST-OPERATIVE DIAGNOSIS:  [name] with multiple cranial osteomas   PROCEDURE:  Procedure(s) with comments: Left frontal and temporal craniotomy for meningioma with removal of osteomas with Brainlab (Left) - Left frontal and temporal craniotomy for meningioma with removal of osteomas with Brainlab APPLICATION OF CRANIAL NAVIGATION (N/A) Osteomas removed:  Vertex, nasion, left frontal and inner table of bone flap  SURGEON:  Surgeon(s) and Role:    Erline Levine, MD - Primary    * Ashok Pall, MD - Assisting  PHYSICIAN ASSISTANT:   ASSISTANTS: [place], RN   ANESTHESIA:   general  EBL:  1000 mL   BLOOD ADMINISTERED:none  DRAINS: none   LOCAL MEDICATIONS USED:  MARCAINE    and LIDOCAINE   SPECIMEN:  Excision  DISPOSITION OF SPECIMEN:  PATHOLOGY  COUNTS:  YES  TOURNIQUET:  * No tourniquets in log *  DICTATION: Patient is 42 year old man with 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  It was elected to take him to surgery for craniotomy for left frontal and temporal brain tumor as well as removal of osteoid osteomas.  He had preop MRI and CT for use of Curve for surgical localization of tumor.  Procedure:  Following smooth intubation, patient was placed in  Supine position.  Head was placed in pins and bicoronal scalp was shaved and prepped and draped in usual sterile fashion after Curve MRI was localized to map tumor location.  Area of planned incision was infiltrated with lidocaine. A linear bicoronal incision was made and carried through temporalis fascia and muscle to expose calvarium on the left.  Osteoid osteomas were drilled down over the vertex, left frontal region, forehead and over the nasion.  Skull flap with great difficulty, as the bone was thickened to over 2 cm  thick and was too thick for conventional router usage.  The skull flap was eventually mobilized and on removal, the dura came off with the flap, as this was fused to the osteomas ans the frontal meningioma also came out with the skull flap.  The brain was carefully dissected away from the meningioma and hemostasis was assured.  The left temporal tumor was then also removed.  This was densely adherent to the overlying dura, which was removed with  The tumor.   The Curve was used to confirm extent of tumor resection.  Hemostasis was assured with irrigation, Surgifoam and cotton balls.  Hemostasis was assured and the tumor cavity was lined with Surgifoam.  A Dura Matrix onlay graft was placed. The tumor and osteomas were removed from the inside of the bone flap with drill and reciprocating saw.  The bone flap was replaced with plates, the fascia and galea were closed with 2-0 vicryl sutures and the skin was re approximated with staples.  A sterile occlusive dressing was placed with a head wrap of Telfa, gauze, kerlix and kling wrap.  .  Patient was returned to a supine position and taken out of head pins, then extubated in the operating room, having tolerated surgery well.  Counts were correct at the end of the case.  PLAN OF CARE: Admit to inpatient   PATIENT DISPOSITION:  PACU - hemodynamically stable.   Delay start of Pharmacological VTE agent (>24hrs) due to surgical blood loss or risk of bleeding: yes

## 2017-06-04 NOTE — Anesthesia Procedure Notes (Addendum)
Procedure Name: Intubation Date/Time: 06/04/2017 7:50 AM Performed by: Annye Asa, MD Pre-anesthesia Checklist: Patient identified, Emergency Drugs available, Suction available, Patient being monitored and Timeout performed Patient Re-evaluated:Patient Re-evaluated prior to induction Oxygen Delivery Method: Circle system utilized Preoxygenation: Pre-oxygenation with 100% oxygen Induction Type: IV induction Ventilation: Two handed mask ventilation required and Oral airway inserted - appropriate to patient size Laryngoscope Size: Mac and 4 Grade View: Grade I Tube type: Oral Tube size: 7.5 mm Number of attempts: 1 Airway Equipment and Method: Stylet Placement Confirmation: ETT inserted through vocal cords under direct vision,  positive ETCO2 and breath sounds checked- equal and bilateral Secured at: 22 cm Dental Injury: Teeth and Oropharynx as per pre-operative assessment

## 2017-06-04 NOTE — Anesthesia Postprocedure Evaluation (Signed)
Anesthesia Post Note  Patient: David Contreras  Procedure(s) Performed: Left frontal and temporal craniotomy for meningioma with removal of osteomas with Brainlab (Left ) APPLICATION OF CRANIAL NAVIGATION (N/A )     Patient location during evaluation: PACU Anesthesia Type: General Level of consciousness: awake and alert, oriented and patient cooperative Pain management: pain level controlled Vital Signs Assessment: post-procedure vital signs reviewed and stable Respiratory status: spontaneous breathing, nonlabored ventilation, respiratory function stable and patient connected to nasal cannula oxygen Cardiovascular status: blood pressure returned to baseline and stable Postop Assessment: no apparent nausea or vomiting Anesthetic complications: no    Last Vitals:  Vitals:   06/04/17 1400 06/04/17 1430  BP: 119/66   Pulse: 85 85  Resp: 19 17  Temp:    SpO2: 95% 96%    Last Pain:  Vitals:   06/04/17 1437  TempSrc:   PainSc: 5                  Ranae Casebier,E. Aliegha Paullin

## 2017-06-04 NOTE — Op Note (Signed)
06/04/2017  11:15 AM  PATIENT:  David Contreras  42 y.o. male  PRE-OPERATIVE DIAGNOSIS:  [name] with multiple cranial osteomas  POST-OPERATIVE DIAGNOSIS:  [name] with multiple cranial osteomas   PROCEDURE:  Procedure(s) with comments: Left frontal and temporal craniotomy for meningioma with removal of osteomas with Brainlab (Left) - Left frontal and temporal craniotomy for meningioma with removal of osteomas with Brainlab APPLICATION OF CRANIAL NAVIGATION (N/A) Osteomas removed:  Vertex, nasion, left frontal and inner table of bone flap  SURGEON:  Surgeon(s) and Role:    Erline Levine, MD - Primary    * Ashok Pall, MD - Assisting  PHYSICIAN ASSISTANT:   ASSISTANTS: Poteat, RN   ANESTHESIA:   general  EBL:  1000 mL   BLOOD ADMINISTERED:none  DRAINS: none   LOCAL MEDICATIONS USED:  MARCAINE    and LIDOCAINE   SPECIMEN:  Excision  DISPOSITION OF SPECIMEN:  PATHOLOGY  COUNTS:  YES  TOURNIQUET:  * No tourniquets in log *  DICTATION: Patient is 42 year old man with 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  It was elected to take him to surgery for craniotomy for left frontal and temporal brain tumor as well as removal of osteoid osteomas.  He had preop MRI and CT for use of Curve for surgical localization of tumor.  Procedure:  Following smooth intubation, patient was placed in  Supine position.  Head was placed in pins and bicoronal scalp was shaved and prepped and draped in usual sterile fashion after Curve MRI was localized to map tumor location.  Area of planned incision was infiltrated with lidocaine. A linear bicoronal incision was made and carried through temporalis fascia and muscle to expose calvarium on the left.  Osteoid osteomas were drilled down over the vertex, left frontal region, forehead and over the nasion.  Skull flap with great difficulty, as the bone was thickened to over 2 cm  thick and was too thick for conventional router usage.  The skull flap was eventually mobilized and on removal, the dura came off with the flap, as this was fused to the osteomas ans the frontal meningioma also came out with the skull flap.  The brain was carefully dissected away from the meningioma and hemostasis was assured.  The left temporal tumor was then also removed.  This was densely adherent to the overlying dura, which was removed with  The tumor.   The Curve was used to confirm extent of tumor resection.  Hemostasis was assured with irrigation, Surgifoam and cotton balls.  Hemostasis was assured and the tumor cavity was lined with Surgifoam.  A Dura Matrix onlay graft was placed. The tumor and osteomas were removed from the inside of the bone flap with drill and reciprocating saw.  The bone flap was replaced with plates, the fascia and galea were closed with 2-0 vicryl sutures and the skin was re approximated with staples.  A sterile occlusive dressing was placed with a head wrap of Telfa, gauze, kerlix and kling wrap.  .  Patient was returned to a supine position and taken out of head pins, then extubated in the operating room, having tolerated surgery well.  Counts were correct at the end of the case.  PLAN OF CARE: Admit to inpatient   PATIENT DISPOSITION:  PACU - hemodynamically stable.   Delay start of Pharmacological VTE agent (>24hrs) due to surgical blood loss or risk of bleeding: yes

## 2017-06-04 NOTE — Anesthesia Procedure Notes (Signed)
Arterial Line Insertion Start/End4/04/2017 6:45 AM, 06/04/2017 7:25 AM Performed by: Imagene Riches, CRNA, CRNA  Patient location: Pre-op. Preanesthetic checklist: patient identified, IV checked, site marked, risks and benefits discussed, surgical consent, monitors and equipment checked, pre-op evaluation, timeout performed and anesthesia consent Lidocaine 1% used for infiltration and patient sedated Right, radial was placed Catheter size: 20 G Hand hygiene performed , maximum sterile barriers used  and Seldinger technique used Allen's test indicative of satisfactory collateral circulation Attempts: 2 Procedure performed without using ultrasound guided technique. Following insertion, Biopatch and dressing applied. Post procedure assessment: normal  Patient tolerated the procedure well with no immediate complications.

## 2017-06-04 NOTE — Progress Notes (Signed)
Patient ID: David Contreras, male   DOB: [birth year], 42 y.o.   MRN: [id number] Alert, conversant with fluent speech. Reports only mild scalp pain. Drsg intact, dry. MAEW. PEARL.

## 2017-06-04 NOTE — Transfer of Care (Signed)
Immediate Anesthesia Transfer of Care Note  Patient: David Contreras  Procedure(s) Performed: Left frontal and temporal craniotomy for meningioma with removal of osteomas with Brainlab (Left ) APPLICATION OF CRANIAL NAVIGATION (N/A )  Patient Location: PACU  Anesthesia Type:General  Level of Consciousness: awake, alert  and oriented  Airway & Oxygen Therapy: Patient Spontanous Breathing and Patient connected to nasal cannula oxygen  Post-op Assessment: Report given to RN, Post -op Vital signs reviewed and stable, Patient moving all extremities X 4 and Patient able to stick tongue midline  Post vital signs: stable  Last Vitals:  Vitals Value Taken Time  BP    Temp    Pulse 91 06/04/2017 11:27 AM  Resp 21 06/04/2017 11:27 AM  SpO2 99 % 06/04/2017 11:27 AM  Vitals shown include unvalidated device data.  Last Pain:  Vitals:   06/04/17 0617  TempSrc: Oral  PainSc:          Complications: No apparent anesthesia complications

## 2017-06-04 NOTE — Interval H&P Note (Signed)
History and Physical Interval Note:  06/04/2017 7:23 AM  David Contreras  has presented today for surgery, with the diagnosis of Meningioma  The various methods of treatment have been discussed with the patient and family. After consideration of risks, benefits and other options for treatment, the patient has consented to  Procedure(s) with comments: Left frontal and temporal craniotomy for meningioma with removal of osteomas with Brainlab (Left) - Left frontal and temporal craniotomy for meningioma with removal of osteomas with [place] (N/A) as a surgical intervention .  The patient's history has been reviewed, patient examined, no change in status, stable for surgery.  I have reviewed the patient's chart and labs.  Questions were answered to the patient's satisfaction.     Waver Dibiasio D

## 2017-06-05 ENCOUNTER — Encounter (HOSPITAL_COMMUNITY): Payer: Self-pay | Admitting: Neurosurgery

## 2017-06-05 ENCOUNTER — Inpatient Hospital Stay (HOSPITAL_COMMUNITY): Payer: No Typology Code available for payment source

## 2017-06-05 LAB — BASIC METABOLIC PANEL
Anion gap: 13 (ref 5–15)
BUN: 14 mg/dL (ref 6–20)
CO2: 21 mmol/L — ABNORMAL LOW (ref 22–32)
CREATININE: 0.9 mg/dL (ref 0.61–1.24)
Calcium: 8.6 mg/dL — ABNORMAL LOW (ref 8.9–10.3)
Chloride: 105 mmol/L (ref 101–111)
Glucose, Bld: 151 mg/dL — ABNORMAL HIGH (ref 65–99)
Potassium: 4 mmol/L (ref 3.5–5.1)
SODIUM: 139 mmol/L (ref 135–145)

## 2017-06-05 LAB — CBC
HCT: 33.9 % — ABNORMAL LOW (ref 39.0–52.0)
Hemoglobin: 11.2 g/dL — ABNORMAL LOW (ref 13.0–17.0)
MCH: 28.6 pg (ref 26.0–34.0)
MCHC: 33 g/dL (ref 30.0–36.0)
MCV: 86.7 fL (ref 78.0–100.0)
PLATELETS: 226 10*3/uL (ref 150–400)
RBC: 3.91 MIL/uL — AB (ref 4.22–5.81)
RDW: 12.8 % (ref 11.5–15.5)
WBC: 13.1 10*3/uL — ABNORMAL HIGH (ref 4.0–10.5)

## 2017-06-05 MED ORDER — GADOBENATE DIMEGLUMINE 529 MG/ML IV SOLN
20.0000 mL | Freq: Once | INTRAVENOUS | Status: AC | PRN
  Administered 2017-06-05: 20 mL via INTRAVENOUS

## 2017-06-05 NOTE — Progress Notes (Addendum)
Subjective: Patient reports "I'm ok...up most of the night...just the beeping and buzzing"  Objective: Vital signs in last 24 hours: Temp:  [98 F (36.7 C)-99.2 F (37.3 C)] 98.3 F (36.8 C) (04/03 0400) Pulse Rate:  [74-95] 83 (04/03 0720) Resp:  [12-26] 13 (04/03 0720) BP: (102-138)/(57-82) 118/62 (04/03 0700) SpO2:  [73 %-100 %] 100 % (04/03 0720) Arterial Line BP: (103-165)/(52-85) 148/64 (04/03 0720)  Intake/Output from previous day: 04/02 0701 - 04/03 0700 In: 4653.3 [P.O.:200; I.V.:3903.3; IV Piggyback:550] Out: 3016 [Urine:3190; Blood:1000] Intake/Output this shift: No intake/output data recorded.  Alert, conversant, reports "minimal" headache. MAEW. No drift. PEARL. Fluent speech. Head wrap clean & dry.  Lab Results: Recent Labs    06/05/17 0444  WBC 13.1*  HGB 11.2*  HCT 33.9*  PLT 226   BMET Recent Labs    06/05/17 0444  NA 139  K 4.0  CL 105  CO2 21*  GLUCOSE 151*  BUN 14  CREATININE 0.90  CALCIUM 8.6*    Studies/Results: Mr Jeri Cos Wo Contrast  Result Date: 06/05/2017 CLINICAL DATA:  Meningioma EXAM: MRI HEAD WITHOUT AND WITH CONTRAST TECHNIQUE: Multiplanar, multiecho pulse sequences of the brain and surrounding structures were obtained without and with intravenous contrast. CONTRAST:  1mL MULTIHANCE GADOBENATE DIMEGLUMINE 529 MG/ML IV SOLN COMPARISON:  None. FINDINGS: BRAIN: The largest left frontal meningioma is have been resected. The largest lesion adjacent to the left temporal lobe anterior pole has also been resected. Multiple other meningiomas are unchanged. This includes an anterior right parafalcine meningioma measuring 13 x 10 mm. Inferior right frontal parafalcine meningioma measures 2.7 x 1.7 cm. Surrounding edema is unchanged. Inferior left frontal parafalcine meningioma measures 2.6 x 1.4 cm. There are 2 inferior temporal meningioma is that measure approximately 10 x 9 mm and 10 x 8 mm. These are also unchanged. There are no new lesions.  Anterior midline shift measures 7 mm, slightly decreased from the prior study. No frank herniation. There is mass effect on the lateral ventricles but no hydrocephalus. There is diffusion restriction along the resection margins, but no other evidence of ischemia. VASCULAR: Major intracranial arterial and venous sinus flow voids are preserved. SKULL AND UPPER CERVICAL SPINE: Status post left frontoparietal craniotomy. SINUSES/ORBITS: No fluid levels or advanced mucosal thickening. No mastoid or middle ear effusion. Normal orbits. IMPRESSION: 1. Resection of the largest meningiomas at the left frontal convexity and adjacent anterior aspect of the left temporal lobe. This will serve as a baseline for future studies. Please note that the areas of ischemia at the resection margins may show contrast enhancement on follow-up studies, not necessarily indicative of recurrent tumor. 2. Slightly decreased rightward midline shift. 3. Unchanged appearance of the multiple other meningiomas. Electronically Signed   By: Ulyses Jarred M.D.   On: 06/05/2017 06:25    Assessment/Plan:   LOS: 1 day  Ok per DrStern to d/c A-line, d/c Foley, mobilize gradually.   Verdis Prime 06/05/2017, 7:48 AM  MRI looks good in terms of resection and shift.  No complicating features.  Transfer to 4 [address] PC.

## 2017-06-05 NOTE — Plan of Care (Signed)
Pt tolerating increase in activity. Sat up in chair and expressed that he wanted to walk around the unit before bed this evening.

## 2017-06-06 ENCOUNTER — Other Ambulatory Visit: Payer: Self-pay

## 2017-06-06 MED ORDER — DEXAMETHASONE 4 MG PO TABS: 4.0000 mg | ORAL_TABLET | Freq: Two times a day (BID) | ORAL | 0 refills | Status: DC

## 2017-06-06 MED ORDER — HYDROCODONE-ACETAMINOPHEN 5-325 MG PO TABS: 1.0000 | ORAL_TABLET | ORAL | 0 refills | Status: DC | PRN

## 2017-06-06 MED FILL — Gelatin Absorbable MT Powder: OROMUCOSAL | Qty: 1 | Status: AC

## 2017-06-06 MED FILL — Thrombin For Soln 20000 Unit: CUTANEOUS | Qty: 1 | Status: AC

## 2017-06-06 MED FILL — Thrombin For Soln 5000 Unit: CUTANEOUS | Qty: 5000 | Status: AC

## 2017-06-06 NOTE — Progress Notes (Addendum)
Subjective: Patient reports "I feel pretty good"  Objective: Vital signs in last 24 hours: Temp:  [97.3 F (36.3 C)-98.4 F (36.9 C)] 98.3 F (36.8 C) (04/04 0834) Pulse Rate:  [58-89] 80 (04/04 0800) Resp:  [13-24] 19 (04/04 0919) BP: (107-135)/(54-89) 135/85 (04/04 0800) SpO2:  [94 %-100 %] 94 % (04/04 0800) Arterial Line BP: (128-129)/(60-65) 129/65 (04/03 1100) Weight:  [109 kg (240 lb 4.8 oz)] 109 kg (240 lb 4.8 oz) (04/04 0919)  Intake/Output from previous day: 04/03 0701 - 04/04 0700 In: 2600 [I.V.:2400; IV Piggyback:200] Out: 1000 [Urine:1000] Intake/Output this shift: Total I/O In: 100 [IV Piggyback:100] Out: -   Alert, conversant, reporting no pain. Some periorbital swelling on the left, as well as forehead (at osteoma site). PEARL. Removal of drsg reveals well-approximated skin edges with staples secure. No bleeding or drainage.   Lab Results: Recent Labs    06/05/17 0444  WBC 13.1*  HGB 11.2*  HCT 33.9*  PLT 226   BMET Recent Labs    06/05/17 0444  NA 139  K 4.0  CL 105  CO2 21*  GLUCOSE 151*  BUN 14  CREATININE 0.90  CALCIUM 8.6*    Studies/Results: Mr Jeri Cos Wo Contrast  Result Date: 06/05/2017 CLINICAL DATA:  Meningioma EXAM: MRI HEAD WITHOUT AND WITH CONTRAST TECHNIQUE: Multiplanar, multiecho pulse sequences of the brain and surrounding structures were obtained without and with intravenous contrast. CONTRAST:  21mL MULTIHANCE GADOBENATE DIMEGLUMINE 529 MG/ML IV SOLN COMPARISON:  None. FINDINGS: BRAIN: The largest left frontal meningioma is have been resected. The largest lesion adjacent to the left temporal lobe anterior pole has also been resected. Multiple other meningiomas are unchanged. This includes an anterior right parafalcine meningioma measuring 13 x 10 mm. Inferior right frontal parafalcine meningioma measures 2.7 x 1.7 cm. Surrounding edema is unchanged. Inferior left frontal parafalcine meningioma measures 2.6 x 1.4 cm. There are 2  inferior temporal meningioma is that measure approximately 10 x 9 mm and 10 x 8 mm. These are also unchanged. There are no new lesions. Anterior midline shift measures 7 mm, slightly decreased from the prior study. No frank herniation. There is mass effect on the lateral ventricles but no hydrocephalus. There is diffusion restriction along the resection margins, but no other evidence of ischemia. VASCULAR: Major intracranial arterial and venous sinus flow voids are preserved. SKULL AND UPPER CERVICAL SPINE: Status post left frontoparietal craniotomy. SINUSES/ORBITS: No fluid levels or advanced mucosal thickening. No mastoid or middle ear effusion. Normal orbits. IMPRESSION: 1. Resection of the largest meningiomas at the left frontal convexity and adjacent anterior aspect of the left temporal lobe. This will serve as a baseline for future studies. Please note that the areas of ischemia at the resection margins may show contrast enhancement on follow-up studies, not necessarily indicative of recurrent tumor. 2. Slightly decreased rightward midline shift. 3. Unchanged appearance of the multiple other meningiomas. Electronically Signed   By: Ulyses Jarred M.D.   On: 06/05/2017 06:25    Assessment/Plan: Improving  LOS: 2 days  Drsg changed, replaced with Telfa and tape. Ok to leave open to air. Ok to shower, using mild shampoo. Norco rx for prn home use. Decadron taper eRx'ed. Office f/u in 2 weeks for staple removal. Discharge to home.   Verdis Prime 06/06/2017, 9:19 AM   Patient is doing well.

## 2017-06-06 NOTE — Progress Notes (Signed)
MD made aware of new swelling around patient's left eye. No new orders. Will continue to monitor.

## 2017-06-06 NOTE — Discharge Summary (Signed)
Physician Discharge Summary  Patient ID: David Contreras MRN: [id number] DOB/AGE: [birth year] 42 y.o.  Admit date: 06/04/2017 Discharge date: 06/06/2017  Admission Diagnoses:[name] with multiple cranial osteomas     Discharge Diagnoses: [name] with multiple cranial osteomas s/p Left frontal and temporal craniotomy for meningioma with removal of osteomas with Brainlab (Left) - Left frontal and temporal craniotomy for meningioma with removal of osteomas with Brainlab APPLICATION OF CRANIAL NAVIGATION (N/A) Osteomas removed:  Vertex, nasion, left frontal and inner table of bone flap       Active Problems:   Brain tumor [facility])   Discharged Condition: good  Hospital Course: David Contreras was admitted for surgery with dx meningiomas and osteomas. Following uncomplicated craniotomy, removal of osteomas, and resection of meningiomas, he recovered well and transferred to Neuro ICU. He has mobilized nicely.   Consults: None  Significant Diagnostic Studies: post-op MRI  Treatments: surgery: Left frontal and temporal craniotomy for meningioma with removal of osteomas with Brainlab (Left) - Left frontal and temporal craniotomy for meningioma with removal of osteomas with Brainlab APPLICATION OF CRANIAL NAVIGATION (N/A) Osteomas removed:  Vertex, nasion, left frontal and inner table of bone flap     Discharge Exam: Blood pressure 135/85, pulse 80, temperature 98.3 F (36.8 C), resp. rate 19, height 6' (1.829 m), weight 109 kg (240 lb 4.8 oz), SpO2 94 %. Alert, conversant, reporting no pain. Some periorbital swelling on the left, as well as forehead (at osteoma site). PEARL. Removal of drsg reveals well-approximated skin edges with staples secure. No bleeding or drainage.      Disposition:  Discharge to home. Ok to leave open to air. Ok to shower, using mild shampoo. Norco rx for prn home use. Decadron taper eRx'ed. Office f/u in 2 weeks for staple removal.      Allergies as of  06/06/2017      Reactions   Chicken Allergy Swelling      Medication List    TAKE these medications   dexamethasone 4 MG tablet Commonly known as:  DECADRON Take 1 tablet (4 mg total) by mouth 2 (two) times daily.   fexofenadine 180 MG tablet Commonly known as:  ALLEGRA Take 180 mg by mouth at bedtime.   HYDROcodone-acetaminophen 5-325 MG tablet Commonly known as:  NORCO/VICODIN Take 1-2 tablets by mouth every 4 (four) hours as needed for moderate pain.   multivitamin with minerals Tabs tablet Take 1 tablet by mouth daily.   omeprazole 20 MG tablet Commonly known as:  PRILOSEC OTC Take 20 mg by mouth at bedtime.        Signed: Verdis Prime 06/06/2017, 9:26 AM

## 2018-01-12 ENCOUNTER — Emergency Department (HOSPITAL_COMMUNITY)
Admission: EM | Admit: 2018-01-12 | Discharge: 2018-01-12 | Disposition: A | Discharge status: Home / Self Care | Payer: 59 | Attending: Emergency Medicine | Admitting: Emergency Medicine

## 2018-01-12 ENCOUNTER — Encounter (HOSPITAL_COMMUNITY): Payer: Self-pay | Admitting: Emergency Medicine

## 2018-01-12 ENCOUNTER — Other Ambulatory Visit: Payer: Self-pay

## 2018-01-12 ENCOUNTER — Emergency Department (HOSPITAL_COMMUNITY): Payer: 59

## 2018-01-12 DIAGNOSIS — R1310 Dysphagia, unspecified: Secondary | ICD-10-CM

## 2018-01-12 DIAGNOSIS — R1013 Epigastric pain: Secondary | ICD-10-CM | POA: Diagnosis not present

## 2018-01-12 DIAGNOSIS — Z79899 Other long term (current) drug therapy: Secondary | ICD-10-CM | POA: Diagnosis not present

## 2018-01-12 LAB — CBC WITH DIFFERENTIAL/PLATELET
ABS IMMATURE GRANULOCYTES: 0.01 10*3/uL (ref 0.00–0.07)
BASOS ABS: 0 10*3/uL (ref 0.0–0.1)
Basophils Relative: 1 %
EOS PCT: 2 %
Eosinophils Absolute: 0.1 10*3/uL (ref 0.0–0.5)
HEMATOCRIT: 45.1 % (ref 39.0–52.0)
HEMOGLOBIN: 15.2 g/dL (ref 13.0–17.0)
IMMATURE GRANULOCYTES: 0 %
LYMPHS ABS: 1.8 10*3/uL (ref 0.7–4.0)
LYMPHS PCT: 32 %
MCH: 29.6 pg (ref 26.0–34.0)
MCHC: 33.7 g/dL (ref 30.0–36.0)
MCV: 87.9 fL (ref 80.0–100.0)
Monocytes Absolute: 0.5 10*3/uL (ref 0.1–1.0)
Monocytes Relative: 9 %
NEUTROS ABS: 3.2 10*3/uL (ref 1.7–7.7)
NEUTROS PCT: 56 %
NRBC: 0 % (ref 0.0–0.2)
Platelets: 192 10*3/uL (ref 150–400)
RBC: 5.13 MIL/uL (ref 4.22–5.81)
RDW: 11.9 % (ref 11.5–15.5)
WBC: 5.8 10*3/uL (ref 4.0–10.5)

## 2018-01-12 LAB — COMPREHENSIVE METABOLIC PANEL
ALBUMIN: 4.1 g/dL (ref 3.5–5.0)
ALK PHOS: 50 U/L (ref 38–126)
ALT: 34 U/L (ref 0–44)
AST: 43 U/L — AB (ref 15–41)
Anion gap: 7 (ref 5–15)
BILIRUBIN TOTAL: 1.5 mg/dL — AB (ref 0.3–1.2)
BUN: 12 mg/dL (ref 6–20)
CALCIUM: 9.2 mg/dL (ref 8.9–10.3)
CO2: 25 mmol/L (ref 22–32)
CREATININE: 0.84 mg/dL (ref 0.61–1.24)
Chloride: 107 mmol/L (ref 98–111)
GFR calc Af Amer: >60 mL/min (ref >60)
Glucose, Bld: 93 mg/dL (ref 70–99)
Potassium: 4.5 mmol/L (ref 3.5–5.1)
SODIUM: 139 mmol/L (ref 135–145)
TOTAL PROTEIN: 6.7 g/dL (ref 6.5–8.1)

## 2018-01-12 MED ORDER — FAMOTIDINE IN NACL 20-0.9 MG/50ML-% IV SOLN
20.0000 mg | Freq: Once | INTRAVENOUS | Status: AC
  Administered 2018-01-12: 20 mg via INTRAVENOUS
  Filled 2018-01-12: qty 50

## 2018-01-12 MED ORDER — FAMOTIDINE 20 MG PO TABS: 20.0000 mg | ORAL_TABLET | Freq: Two times a day (BID) | ORAL | 0 refills | Status: DC

## 2018-01-12 NOTE — ED Notes (Signed)
Pt verbalized understanding of discharge paperwork, prescriptions and follow-up care 

## 2018-01-12 NOTE — ED Triage Notes (Signed)
Pt reports feeling like he has food stuck in his esophagus. Reports the same thing happened about 2 years ago that required GI doctor. Reports trying to drink water but it comes right back up and some indigestion.

## 2018-01-12 NOTE — Discharge Instructions (Addendum)
I spoke with one of Dr. Kathline Magic colleagues tonight. You are ok to follow-up with Dr. Michail Sermon as an outpatient. Give his office a call tomorrow to schedule something. I have prescribed you a reflux medication, Famotidine (Pepcid), that you should take daily to assist with indigestion. As far as your diet, eat what you feel like you can tolerate.   Thank you for allowing [place] to take care of you today.

## 2018-01-12 NOTE — ED Provider Notes (Addendum)
[facility] EMERGENCY DEPARTMENT Provider Note  CSN: [id number] Arrival date & time: 01/12/18  1652    History   Chief Complaint Chief Complaint  Patient presents with  . Dysphagia    HPI David Contreras is a 42 y.o. male with a medical history of meningioma, esophageal stricture, GERD who presented to the ED for   Foreign Body  The current episode started yesterday. The foreign body is suspected to be swallowed. The foreign body is food. The incident was suspected. Causes for concern: Past hx of esophageal stricture. Associated symptoms include vomiting (forced) and abdominal pain (epigastric). Pertinent negatives include no sore throat, no trouble swallowing, no difficulty breathing, no choking, no cough and no chest pain. Associated symptoms comments: Unable to tolerate liquids well. Reports that it stays down, but feels the need to vomit and indigestion afterward. Associated medical issues include esophageal disease (Balloon dilatation performed in 05/2016 by Dr. Wilford Corner).   Past Medical History:  Diagnosis Date  . [name]   . Esophageal stricture   . Genetic testing 05/31/17   Multi-Cancer panel (83 genes) @ [facility] - No pathogenic mutations detected  . GERD (gastroesophageal reflux disease)   . Headache   . [name]   . [name], [name] 2003   Patient Active Problem List   Diagnosis Date Noted  . Brain tumor ([name]) 06/04/2017  . Genetic testing   . [name], multiple ([name])   . Food impaction of esophagus 04/26/2016  . Dysphagia 04/26/2016    Past Surgical History:  Procedure Laterality Date  . ANKLE SURGERY    . APPLICATION OF CRANIAL NAVIGATION N/A 06/04/2017   Procedure: APPLICATION OF CRANIAL NAVIGATION;  Surgeon: Erline Levine, MD;  Location: [facility];  Service: Neurosurgery;  Laterality: N/A;  . BALLOON DILATION N/A 05/03/2016   Procedure: BALLOON DILATION;  Surgeon: Wilford Corner, MD;  Location: [facility]  ENDOSCOPY;  Service: Endoscopy;  Laterality: N/A;  . CRANIOTOMY Left 06/04/2017   Procedure: Left frontal and temporal craniotomy for meningioma with removal of osteomas with Brainlab;  Surgeon: Erline Levine, MD;  Location: [facility];  Service: Neurosurgery;  Laterality: Left;  Left frontal and temporal craniotomy for meningioma with removal of osteomas with Brainlab  . ESOPHAGOGASTRODUODENOSCOPY N/A 04/26/2016   Procedure: ESOPHAGOGASTRODUODENOSCOPY (EGD);  Surgeon: Wilford Corner, MD;  Location: [facility];  Service: Endoscopy;  Laterality: N/A;  . ESOPHAGOGASTRODUODENOSCOPY (EGD) WITH PROPOFOL N/A 05/03/2016   Procedure: ESOPHAGOGASTRODUODENOSCOPY (EGD) WITH PROPOFOL;  Surgeon: Wilford Corner, MD;  Location: [facility] ENDOSCOPY;  Service: Endoscopy;  Laterality: N/A;    Home Medications    Prior to Admission medications   Medication Sig Start Date End Date Taking? Authorizing Provider  dexamethasone (DECADRON) 4 MG tablet Take 1 tablet (4 mg total) by mouth 2 (two) times daily. 06/06/17   Erline Levine, MD  famotidine (PEPCID) 20 MG tablet Take 1 tablet (20 mg total) by mouth 2 (two) times daily. 01/12/18   Lousie Calico, Alvie Heidelberg I, PA-C  fexofenadine (ALLEGRA) 180 MG tablet Take 180 mg by mouth at bedtime.     [provider]  HYDROcodone-acetaminophen (NORCO/VICODIN) 5-325 MG tablet Take 1-2 tablets by mouth every 4 (four) hours as needed for moderate pain. 06/06/17   Erline Levine, MD  Multiple Vitamin (MULTIVITAMIN WITH MINERALS) TABS tablet Take 1 tablet by mouth daily.    [provider]  omeprazole (PRILOSEC OTC) 20 MG tablet Take 20 mg by mouth at bedtime.    [provider]  Family History Family History  Problem Relation Age of Onset  . Pancreatic cancer Paternal Grandmother 22       deceased 28    Social History Social History   Tobacco Use  . Smoking status: Never Smoker  . Smokeless tobacco: Never Used  Substance Use Topics  . Alcohol use: Yes     Comment: rarely - 5 in a month  . Drug use: No     Allergies   Chicken allergy   Review of Systems Review of Systems  Constitutional: Negative.   HENT: Negative for sore throat and trouble swallowing.   Respiratory: Negative for cough and choking.   Cardiovascular: Negative for chest pain.  Gastrointestinal: Positive for abdominal pain (epigastric) and vomiting (forced). Negative for abdominal distention and nausea.  All other systems reviewed and are negative.    Physical Exam Updated Vital Signs BP 132/82   Pulse 70   Temp 98.8 F (37.1 C) (Oral)   Ht 6' (1.829 m)   Wt 102.1 kg   SpO2 99%   BMI 30.52 kg/m   Physical Exam  Constitutional: He appears well-developed and well-nourished. No distress.  Sitting comfortably in the bed. Able to speak in complete sentences.  HENT:  Mouth/Throat: Uvula is midline, oropharynx is clear and moist and mucous membranes are normal. No posterior oropharyngeal edema.  Neck: Trachea normal, normal range of motion, full passive range of motion without pain and phonation normal. Neck supple.  Cardiovascular: Normal rate, regular rhythm and normal heart sounds.  No murmur heard. Pulmonary/Chest: Effort normal and breath sounds normal.  Abdominal: Soft. Normal appearance and bowel sounds are normal. There is no tenderness.  Nursing note and vitals reviewed.  ED Treatments / Results  Labs (all labs ordered are listed, but only abnormal results are displayed) Labs Reviewed  COMPREHENSIVE METABOLIC PANEL - Abnormal; Notable for the following components:      Result Value   AST 43 (*)    Total Bilirubin 1.5 (*)    All other components within normal limits  CBC WITH DIFFERENTIAL/PLATELET    EKG None  Radiology Dg Chest 2 View  Result Date: 01/12/2018 CLINICAL DATA:  Patient feels as if food is stuck in throat. Known esophageal stricture. EXAM: CHEST - 2 VIEW COMPARISON:  03/01/2013. FINDINGS: The heart size and mediastinal  contours are within normal limits. Both lungs are clear. The visualized skeletal structures are unremarkable. IMPRESSION: No active cardiopulmonary disease. Electronically Signed   By: Staci Righter M.D.   On: 01/12/2018 18:49    Procedures Procedures (including critical care time)  Medications Ordered in ED Medications  famotidine (PEPCID) IVPB 20 mg premix (20 mg Intravenous New Bag/Given 01/12/18 1726)   Initial Impression / Assessment and Plan / ED Course  Triage vital signs and the nursing notes have been reviewed.  Pertinent labs & imaging results that were available during care of the patient were reviewed and considered in medical decision making (see chart for details).  Patient with a history of esophageal stricture presents to the ED with concerns of food impaction that occurred yesterday. On exam, patient is not in distress. He is able to phonate and swallow without issue. He has no abdominal tenderness, chest pain or any other s/s to suggest food impaction that warrants emergent intervention. It is reassuring that he was able to tolerate solids and liquids prior to coming to the ED earlier today. Will order CXR to evaluate for other acute causes of epigastric discomfort.  Clinical Course  as of Jan 12 1899  Sun Jan 12, 2018  1824 CXR normal.   [GM]  1845 Case discussed with on-call GI provider who states that patient can follow-up as an outpatient with previous GI provider, Dr. Michail Sermon.   [GM]    Clinical Course User Index [GM] Kathlee Barnhardt, Jonelle Sports, PA-C     Final Clinical Impressions(s) / ED Diagnoses  1. Dysphagia. No evidence of food impaction. Rx for Pepcid for indigestion. Referral to [facility] (Dr. Michail Sermon) for follow-up.  Dispo: Home. After thorough clinical evaluation, this patient is determined to be medically stable and can be safely discharged with the previously mentioned treatment and/or outpatient follow-up/referral(s). At this time, there are no other  apparent medical conditions that require further screening, evaluation or treatment.  Final diagnoses:  Dysphagia, unspecified type    ED Discharge Orders         Ordered    famotidine (PEPCID) 20 MG tablet  2 times daily     01/12/18 1859            Romie Jumper, PA-C 01/12/18 1900    Kamdyn Covel, [place] I, PA-C 01/12/18 Ilsa Iha    Duffy Bruce, MD 01/14/18 0500

## 2018-01-12 NOTE — ED Notes (Signed)
Pt passed swallow screen but pt did report his swallowing felt different than normal.

## 2018-05-01 ENCOUNTER — Emergency Department (HOSPITAL_COMMUNITY)
Admission: EM | Admit: 2018-05-01 | Discharge: 2018-05-01 | Disposition: A | Discharge status: Home / Self Care | Payer: No Typology Code available for payment source | Attending: Emergency Medicine | Admitting: Emergency Medicine

## 2018-05-01 ENCOUNTER — Emergency Department (HOSPITAL_COMMUNITY): Payer: No Typology Code available for payment source

## 2018-05-01 ENCOUNTER — Encounter (HOSPITAL_COMMUNITY): Payer: Self-pay | Admitting: Emergency Medicine

## 2018-05-01 DIAGNOSIS — E860 Dehydration: Secondary | ICD-10-CM | POA: Insufficient documentation

## 2018-05-01 DIAGNOSIS — R42 Dizziness and giddiness: Secondary | ICD-10-CM | POA: Diagnosis present

## 2018-05-01 DIAGNOSIS — R55 Syncope and collapse: Secondary | ICD-10-CM | POA: Diagnosis not present

## 2018-05-01 LAB — BASIC METABOLIC PANEL
ANION GAP: 13 (ref 5–15)
BUN: 14 mg/dL (ref 6–20)
CALCIUM: 9.7 mg/dL (ref 8.9–10.3)
CO2: 24 mmol/L (ref 22–32)
CREATININE: 0.86 mg/dL (ref 0.61–1.24)
Chloride: 103 mmol/L (ref 98–111)
GFR calc Af Amer: >60 mL/min (ref >60)
Glucose, Bld: 93 mg/dL (ref 70–99)
Potassium: 3.7 mmol/L (ref 3.5–5.1)
Sodium: 140 mmol/L (ref 135–145)

## 2018-05-01 LAB — URINALYSIS, ROUTINE W REFLEX MICROSCOPIC
Bacteria, UA: NONE SEEN
Bilirubin Urine: NEGATIVE
GLUCOSE, UA: NEGATIVE mg/dL
HGB URINE DIPSTICK: NEGATIVE
Ketones, ur: NEGATIVE mg/dL
Leukocytes,Ua: NEGATIVE
Nitrite: NEGATIVE
PH: 5 (ref 5.0–8.0)
Protein, ur: 30 mg/dL — AB
SPECIFIC GRAVITY, URINE: 1.017 (ref 1.005–1.030)

## 2018-05-01 LAB — CBC
HCT: 52.2 % — ABNORMAL HIGH (ref 39.0–52.0)
Hemoglobin: 17.4 g/dL — ABNORMAL HIGH (ref 13.0–17.0)
MCH: 29.5 pg (ref 26.0–34.0)
MCHC: 33.3 g/dL (ref 30.0–36.0)
MCV: 88.6 fL (ref 80.0–100.0)
PLATELETS: 292 10*3/uL (ref 150–400)
RBC: 5.89 MIL/uL — AB (ref 4.22–5.81)
RDW: 12.2 % (ref 11.5–15.5)
WBC: 8.1 10*3/uL (ref 4.0–10.5)
nRBC: 0 % (ref 0.0–0.2)

## 2018-05-01 LAB — I-STAT TROPONIN, ED: Troponin i, poc: 0 ng/mL (ref 0.00–0.08)

## 2018-05-01 LAB — CBG MONITORING, ED: Glucose-Capillary: 113 mg/dL — ABNORMAL HIGH (ref 70–99)

## 2018-05-01 MED ORDER — SODIUM CHLORIDE 0.9% FLUSH: 3.0000 mL | Freq: Once | INTRAVENOUS | Status: DC

## 2018-05-01 MED ORDER — SODIUM CHLORIDE 0.9 % IV BOLUS
1000.0000 mL | Freq: Once | INTRAVENOUS | Status: AC
  Administered 2018-05-01: 1000 mL via INTRAVENOUS

## 2018-05-01 NOTE — ED Triage Notes (Signed)
Pt states that he started feeling dizzy and had a near syncopal episode approx. 1 hour ago. Hx of brain tumors. Had two tumors removed last year and still has 5 benign tumors remaining. Alert and oriented. No slurred speech, No weakness, no visual disturbanc.e

## 2018-05-01 NOTE — Discharge Instructions (Addendum)
You were evaluated in the Emergency Department and after careful evaluation, we did not find any emergent condition requiring admission or further testing in the hospital.  Your symptoms today seem to be due to dehydration.  Please continue to hydrate yourself at home.  Please return to the Emergency Department if you experience any worsening of your condition.  We encourage you to follow up with a primary care provider.  Thank you for allowing [place] to be a part of your care.

## 2018-05-01 NOTE — ED Provider Notes (Signed)
[facility] Emergency Department Provider Note MRN:  [id number]  [place] date & time: 05/01/18     Chief Complaint   Dizziness and Near Syncope   History of Present Illness   David Contreras is a 43 y.o. year-old male with a history of brain tumor presenting to the ED with chief complaint of dizziness.  Patient explains that about 1 hour prior to arrival, patient was at work, standing, not exerting himself, when he became very lightheaded, felt like he was going to pass out, did not pass out, did not fall down, no trauma.  Continues to feel general malaise, generalized weakness, denies numbness or weakness to the arms or legs, no headache or vision change, no vomiting, no chest pain or shortness of breath.  Symptoms have been improving since onset with time, patient does endorse drinking heavily yesterday evening.  Review of Systems  A complete 10 system review of systems was obtained and all systems are negative except as noted in the HPI and PMH.   Patient's Health History    Past Medical History:  Diagnosis Date  . Brain tumor ([place])   . Esophageal stricture   . Genetic testing 05/31/17   Multi-Cancer panel (83 genes) @ [facility] - No pathogenic mutations detected  . GERD (gastroesophageal reflux disease)   . Headache   . Meningioma ([place])   . 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 ANKLE SURGERY    . APPLICATION OF CRANIAL NAVIGATION N/A 06/04/2017   Procedure: APPLICATION OF CRANIAL NAVIGATION;  Surgeon: Erline Levine, MD;  Location: [facility];  Service: Neurosurgery;  Laterality: N/A;  . BALLOON DILATION N/A 05/03/2016   Procedure: BALLOON DILATION;  Surgeon: Wilford Corner, MD;  Location: [facility] ENDOSCOPY;  Service: Endoscopy;  Laterality: N/A;  . CRANIOTOMY Left 06/04/2017   Procedure: Left frontal and temporal craniotomy for meningioma with removal of osteomas with Brainlab;  Surgeon: Erline Levine, MD;  Location: [facility];  Service: Neurosurgery;  Laterality: Left;  Left frontal and temporal craniotomy for meningioma with removal of osteomas with Brainlab  . ESOPHAGOGASTRODUODENOSCOPY N/A 04/26/2016   Procedure: ESOPHAGOGASTRODUODENOSCOPY (EGD);  Surgeon: Wilford Corner, MD;  Location: [facility];  Service: Endoscopy;  Laterality: N/A;  . ESOPHAGOGASTRODUODENOSCOPY (EGD) WITH PROPOFOL N/A 05/03/2016   Procedure: ESOPHAGOGASTRODUODENOSCOPY (EGD) WITH PROPOFOL;  Surgeon: Wilford Corner, MD;  Location[facility];  Service: Endoscopy;  Laterality: N/A;    Family History  Problem Relation Age of Onset  . Pancreatic cancer Paternal Grandmother 11       deceased 44    Social History   Socioeconomic History  . Marital status: Single    Spouse name: Not on file  . Number of children: Not on file  . Years of education: Not on file  . Highest education level: Not on file  Occupational History  . Not on file  Social Needs  . Financial resource strain: Not on file  . Food insecurity:    Worry: Not on file    Inability: Not on file  . Transportation needs:    Medical: Not on file    Non-medical: Not on file  Tobacco Use  . Smoking status: Never Smoker  . Smokeless tobacco: Never Used  Substance and Sexual Activity  . Alcohol use: Yes    Comment: rarely - 5 in a month  . Drug use: No  . Sexual activity: Not on file  Lifestyle  . Physical activity:  Days per week: Not on file    Minutes per session: Not on file  . Stress: Not on file  Relationships  . Social connections:    Talks on phone: Not on file    Gets together: Not on file    Attends religious service: Not on file    Active member of club or organization: Not on file    Attends meetings of clubs or organizations: Not on file    Relationship status: Not on file  . Intimate partner violence:    Fear of current or ex partner: Not on file    Emotionally abused: Not on file    Physically abused: Not on file    Forced sexual  activity: Not on file  Other Topics Concern  . Not on file  Social History Narrative  . Not on file     Physical Exam  Vital Signs and Nursing Notes reviewed Vitals:   05/01/18 1430 05/01/18 1500  BP: 136/89 (!) 146/88  Pulse: (!) 108 (!) 108  Resp: 17 19  Temp:    SpO2: 99% 99%    CONSTITUTIONAL: Well-appearing, NAD NEURO:  Alert and oriented x 3, no focal deficits EYES:  eyes equal and reactive ENT/NECK:  no LAD, no JVD CARDIO: Tachycardic rate, well-perfused, normal S1 and S2 PULM:  CTAB no wheezing or rhonchi GI/GU:  normal bowel sounds, non-distended, non-tender MSK/SPINE:  No gross deformities, no edema SKIN:  no rash, atraumatic PSYCH:  Appropriate speech and behavior  Diagnostic and Interventional Summary    EKG Interpretation  Date/Time:  Thursday May 01 2018 12:23:09 EST Ventricular Rate:  118 PR Interval:    QRS Duration: 90 QT Interval:  304 QTC Calculation: 426 R Axis:   62 Text Interpretation:  Sinus Tach Borderline T abnormalities, inferior leads Confirmed by Gerlene Fee [phone]) on 05/01/2018 12:31:49 PM      Labs Reviewed  CBC - Abnormal; Notable for the following components:      Result Value   RBC 5.89 (*)    Hemoglobin 17.4 (*)    HCT 52.2 (*)    All other components within normal limits  URINALYSIS, ROUTINE W REFLEX MICROSCOPIC - Abnormal; Notable for the following components:   Protein, ur 30 (*)    All other components within normal limits  CBG MONITORING, ED - Abnormal; Notable for the following components:   Glucose-Capillary 113 (*)    All other components within normal limits  BASIC METABOLIC PANEL  I-STAT TROPONIN, ED    CT HEAD WO CONTRAST  Final Result      Medications  sodium chloride flush (NS) 0.9 % injection 3 mL (0 mLs Intravenous Hold 05/01/18 1237)  sodium chloride 0.9 % bolus 1,000 mL (0 mLs Intravenous Stopped 05/01/18 1520)  sodium chloride 0.9 % bolus 1,000 mL (0 mLs Intravenous Stopped 05/01/18 1520)      Procedures Critical Care  ED Course and Medical Decision Making  I have reviewed the triage vital signs and the nursing notes.  Pertinent labs & imaging results that were available during my care of the patient were reviewed by me and considered in my medical decision making (see below for details).  Favoring dehydration related to hangover in this 43 year old male, however given history will need to CT head to exclude ICH given his brain tumors.  Patient has no chest pain or shortness of breath or evidence of DVT to suggest PE.  Patient feeling much better after IV fluids, tachycardia resolved, work-up unrevealing,  normal CT head, hemoconcentration consistent with dehydration related to night of drinking last night.  EKG with normal intervals, troponin is negative, nothing to suggest cardiac process.  After the discussed management above, the patient was determined to be safe for discharge.  The patient was in agreement with this plan and all questions regarding their care were answered.  ED return precautions were discussed and the patient will return to the ED with any significant worsening of condition.  Barth Kirks. Sedonia Small, [place] [email or web address]  Final Clinical Impressions(s) / ED Diagnoses     ICD-10-CM   1. Near syncope R55   2. Dehydration E86.0     ED Discharge Orders    None         Maudie Flakes, MD 05/01/18 [phone]

## 2019-05-02 ENCOUNTER — Other Ambulatory Visit: Payer: Self-pay

## 2019-05-02 ENCOUNTER — Encounter (HOSPITAL_BASED_OUTPATIENT_CLINIC_OR_DEPARTMENT_OTHER): Payer: Self-pay | Admitting: *Deleted

## 2019-05-02 ENCOUNTER — Emergency Department (HOSPITAL_BASED_OUTPATIENT_CLINIC_OR_DEPARTMENT_OTHER)
Admission: EM | Admit: 2019-05-02 | Discharge: 2019-05-02 | Disposition: A | Discharge status: Home / Self Care | Payer: No Typology Code available for payment source | Attending: Emergency Medicine | Admitting: Emergency Medicine

## 2019-05-02 ENCOUNTER — Emergency Department (HOSPITAL_BASED_OUTPATIENT_CLINIC_OR_DEPARTMENT_OTHER): Payer: No Typology Code available for payment source

## 2019-05-02 DIAGNOSIS — R42 Dizziness and giddiness: Secondary | ICD-10-CM | POA: Diagnosis not present

## 2019-05-02 DIAGNOSIS — Z79899 Other long term (current) drug therapy: Secondary | ICD-10-CM | POA: Insufficient documentation

## 2019-05-02 LAB — CBC WITH DIFFERENTIAL/PLATELET
Abs Immature Granulocytes: 0.02 K/uL (ref 0.00–0.07)
Basophils Absolute: 0 K/uL (ref 0.0–0.1)
Basophils Relative: 0 %
Eosinophils Absolute: 0.1 K/uL (ref 0.0–0.5)
Eosinophils Relative: 1 %
HCT: 48.5 % (ref 39.0–52.0)
Hemoglobin: 16.7 g/dL (ref 13.0–17.0)
Immature Granulocytes: 0 %
Lymphocytes Relative: 20 %
Lymphs Abs: 2.1 K/uL (ref 0.7–4.0)
MCH: 29.5 pg (ref 26.0–34.0)
MCHC: 34.4 g/dL (ref 30.0–36.0)
MCV: 85.5 fL (ref 80.0–100.0)
Monocytes Absolute: 1 K/uL (ref 0.1–1.0)
Monocytes Relative: 9 %
Neutro Abs: 7.5 K/uL (ref 1.7–7.7)
Neutrophils Relative %: 70 %
Platelets: 301 K/uL (ref 150–400)
RBC: 5.67 MIL/uL (ref 4.22–5.81)
RDW: 12 % (ref 11.5–15.5)
WBC: 10.7 K/uL — ABNORMAL HIGH (ref 4.0–10.5)
nRBC: 0 % (ref 0.0–0.2)

## 2019-05-02 LAB — COMPREHENSIVE METABOLIC PANEL WITH GFR
ALT: 46 U/L — ABNORMAL HIGH (ref 0–44)
AST: 52 U/L — ABNORMAL HIGH (ref 15–41)
Albumin: 4.8 g/dL (ref 3.5–5.0)
Alkaline Phosphatase: 67 U/L (ref 38–126)
Anion gap: 11 (ref 5–15)
BUN: 14 mg/dL (ref 6–20)
CO2: 25 mmol/L (ref 22–32)
Calcium: 9.7 mg/dL (ref 8.9–10.3)
Chloride: 102 mmol/L (ref 98–111)
Creatinine, Ser: 0.92 mg/dL (ref 0.61–1.24)
GFR calc Af Amer: >60 mL/min
GFR calc non Af Amer: >60 mL/min
Glucose, Bld: 125 mg/dL — ABNORMAL HIGH (ref 70–99)
Potassium: 3.7 mmol/L (ref 3.5–5.1)
Sodium: 138 mmol/L (ref 135–145)
Total Bilirubin: 1.3 mg/dL — ABNORMAL HIGH (ref 0.3–1.2)
Total Protein: 8.1 g/dL (ref 6.5–8.1)

## 2019-05-02 MED ORDER — SODIUM CHLORIDE 0.9 % IV BOLUS (SEPSIS)
1000.0000 mL | Freq: Once | INTRAVENOUS | Status: AC
  Administered 2019-05-02: 1000 mL via INTRAVENOUS

## 2019-05-02 NOTE — ED Triage Notes (Addendum)
Pt reports he drank too much etoh yesterday (reports 6-8 beers). States today he has been feeling lightheaded. Denies vomiting. States "I just didn't want to take any chances". Pt has hx of brain tumors

## 2019-05-02 NOTE — ED Notes (Signed)
ED Provider at bedside. 

## 2019-05-02 NOTE — ED Provider Notes (Addendum)
[facility] Provider Note   CSN: [id number] Arrival date & time: 05/02/19  2013     History Chief Complaint  Patient presents with  . Dizziness    David Contreras is a 44 y.o. male.  Patient is a 44 year old male with past medical history of meningioma of the brain, GERD presenting to the emergency department for dizziness and lightheadedness after a night of drinking.  Patient reports that he went out with friends last night and had about a sixpack of beer reports this morning woke up feeling lightheaded and dizzy and dehydrated.  Patient reports that he was concerned because of his history of meningioma.  Reports that the symptoms have actually improved throughout the day.  He denies any nausea, vomiting, vision changes, syncope, numbness, tingling, neck pain, chest pain, shortness of breath.        Past Medical History:  Diagnosis Date  . Brain tumor ([place])   . Esophageal stricture   . Genetic testing 05/31/17   Multi-Cancer panel (83 genes) @ [facility] - No pathogenic mutations detected  . GERD (gastroesophageal reflux disease)   . Headache   . Meningioma ([place])   . [name], multiple ([place]) 2003    Patient Active Problem List   Diagnosis Date Noted  . Brain tumor ([place]) 06/04/2017  . Genetic testing   . [name], multiple ([place])   . Food impaction of esophagus 04/26/2016  . Dysphagia 04/26/2016    Past Surgical History:  Procedure Laterality Date  . ANKLE SURGERY    . APPLICATION OF CRANIAL NAVIGATION N/A 06/04/2017   Procedure: APPLICATION OF CRANIAL NAVIGATION;  Surgeon: Erline Levine, MD;  Location: [facility];  Service: Neurosurgery;  Laterality: N/A;  . BALLOON DILATION N/A 05/03/2016   Procedure: BALLOON DILATION;  Surgeon: Wilford Corner, MD;  Location: [facility] ENDOSCOPY;  Service: Endoscopy;  Laterality: N/A;  . CRANIOTOMY Left 06/04/2017   Procedure: Left frontal and temporal craniotomy for meningioma with removal of osteomas with  [facility];  Surgeon: Erline Levine, MD;  Location: [facility];  Service: Neurosurgery;  Laterality: Left;  Left frontal and temporal craniotomy for meningioma with removal of osteomas with Brain[facility]  . ESOPHAGOGASTRODUODENOSCOPY N/A 04/26/2016   Procedure: ESOPHAGOGASTRODUODENOSCOPY (EGD);  Surgeon: Wilford Corner, MD;  Location: [facility];  Service: Endoscopy;  Laterality: N/A;  . ESOPHAGOGASTRODUODENOSCOPY (EGD) WITH PROPOFOL N/A 05/03/2016   Procedure: ESOPHAGOGASTRODUODENOSCOPY (EGD) WITH PROPOFOL;  Surgeon: Wilford Corner, MD;  Location: [facility] ENDOSCOPY;  Service: Endoscopy;  Laterality: N/A;       Family History  Problem Relation Age of Onset  . Pancreatic cancer Paternal Grandmother 73       deceased 13    Social History   Tobacco Use  . Smoking status: Never Smoker  . Smokeless tobacco: Never Used  Substance Use Topics  . Alcohol use: Yes    Comment: rarely - 5 in a month  . Drug use: Not Currently    Home Medications Prior to Admission medications   Medication Sig Start Date End Date Taking? Authorizing Provider  dexamethasone (DECADRON) 4 MG tablet Take 1 tablet (4 mg total) by mouth 2 (two) times daily. Patient not taking: Reported on 05/01/2018 06/06/17   Erline Levine, MD  famotidine (PEPCID) 20 MG tablet Take 1 tablet (20 mg total) by mouth 2 (two) times daily. 01/12/18   Mortis, Alvie Heidelberg I, PA-C  fluticasone (FLONASE) 50 MCG/ACT nasal spray Place 1 spray into both nostrils daily.    [provider]  HYDROcodone-acetaminophen (NORCO/VICODIN) 5-325  MG tablet Take 1-2 tablets by mouth every 4 (four) hours as needed for moderate pain. Patient not taking: Reported on 05/01/2018 06/06/17   Erline Levine, MD  Multiple Vitamin (MULTIVITAMIN WITH MINERALS) TABS tablet Take 1 tablet by mouth daily.    [provider]  omeprazole (PRILOSEC OTC) 20 MG tablet Take 20 mg by mouth daily.     [provider]    Allergies    Chicken allergy  Review of Systems    Review of Systems  Constitutional: Negative for chills and fever.  HENT: Negative for congestion, rhinorrhea and sore throat.   Eyes: Negative for visual disturbance.  Respiratory: Negative for cough and shortness of breath.   Cardiovascular: Negative for chest pain.  Gastrointestinal: Negative for abdominal pain, nausea and vomiting.  Genitourinary: Negative for dysuria.  Musculoskeletal: Negative for back pain and myalgias.  Skin: Negative for rash.  Allergic/Immunologic: Negative for immunocompromised state.  Neurological: Positive for dizziness and light-headedness. Negative for headaches.    Physical Exam Updated Vital Signs BP (!) 157/99   Pulse 87   Temp 98.7 F (37.1 C) (Oral)   Resp 17   Ht 5\' 11"  (1.803 m)   Wt 108.9 kg   SpO2 96%   BMI 33.47 kg/m   Physical Exam Vitals and nursing note reviewed.  Constitutional:      General: He is not in acute distress.    Appearance: Normal appearance. He is not ill-appearing or diaphoretic.  HENT:     Head: Normocephalic.     Mouth/Throat:     Mouth: Mucous membranes are moist.  Eyes:     Conjunctiva/sclera: Conjunctivae normal.  Cardiovascular:     Rate and Rhythm: Normal rate and regular rhythm.  Pulmonary:     Effort: Pulmonary effort is normal.  Skin:    General: Skin is warm and dry.  Neurological:     General: No focal deficit present.     Mental Status: He is alert and oriented to person, place, and time.     Cranial Nerves: No cranial nerve deficit.     Sensory: No sensory deficit.     Motor: No weakness.     Gait: Gait normal.  Psychiatric:        Mood and Affect: Mood normal.     ED Results / Procedures / Treatments   Labs (all labs ordered are listed, but only abnormal results are displayed) Labs Reviewed  COMPREHENSIVE METABOLIC PANEL - Abnormal; Notable for the following components:      Result Value   Glucose, Bld 125 (*)    AST 52 (*)    ALT 46 (*)    Total Bilirubin 1.3 (*)    All other  components within normal limits  CBC WITH DIFFERENTIAL/PLATELET - Abnormal; Notable for the following components:   WBC 10.7 (*)    All other components within normal limits    EKG EKG Interpretation  Date/Time:  Saturday May 02 2019 21:01:21 EST Ventricular Rate:  93 PR Interval:    QRS Duration: 96 QT Interval:  335 QTC Calculation: 417 R Axis:   45 Text Interpretation: Sinus rhythm Borderline T wave abnormalities Baseline wander in lead(s) V4 No STEMI Confirmed by Octaviano Glow [id number]) on 05/02/2019 9:22:21 PM   Radiology DG Chest 2 View  Result Date: 05/02/2019 CLINICAL DATA:  Alcohol abuse, lightheadedness EXAM: CHEST - 2 VIEW COMPARISON:  01/12/2018 FINDINGS: The heart size and mediastinal contours are within normal limits. Both lungs are clear. The  visualized skeletal structures are unremarkable. IMPRESSION: No active cardiopulmonary disease. Electronically Signed   By: Randa Ngo M.D.   On: 05/02/2019 21:07   CT Head Wo Contrast  Result Date: 05/02/2019 CLINICAL DATA:  Alcohol abuse, dizziness EXAM: CT HEAD WITHOUT CONTRAST TECHNIQUE: Contiguous axial images were obtained from the base of the skull through the vertex without intravenous contrast. COMPARISON:  05/01/2018 FINDINGS: Brain: No acute infarct or hemorrhage. Chronic postsurgical encephalomalacia left frontal lobe. Lateral ventricles and midline structures are unremarkable. No acute extra-axial fluid collections. Calcified meningiomas along the floor the anterior cranial fossa not appreciably different than previous study, largest measuring 2.8 x 1.8 cm. Vascular: No hyperdense vessel or unexpected calcification. Skull: Postsurgical changes from prior left frontal craniotomy, stable. No acute bony abnormality. Sinuses/Orbits: No acute finding. Other: None IMPRESSION: 1. No acute infarct or hemorrhage. 2. Stable postsurgical changes with left frontal lobe encephalomalacia. 3. Stable meningiomas arising from the  floor the anterior cranial fossa. Electronically Signed   By: Randa Ngo M.D.   On: 05/02/2019 21:06    Procedures Procedures (including critical care time)  Medications Ordered in ED Medications  sodium chloride 0.9 % bolus 1,000 mL (1,000 mLs Intravenous New Bag/Given 05/02/19 2100)    ED Course  I have reviewed the triage vital signs and the nursing notes.  Pertinent labs & imaging results that were available during my care of the patient were reviewed by me and considered in my medical decision making (see chart for details).  Clinical Course as of May 01 2140  Sat May 02, 2019  2119 Patient with hx of meningioma presenting to ER for lightheadedness/dizziness after a night of heavy drinking. Symptoms improving with fluids. Workup reassuring including head CT, chest xray, EKG, labs. Favor these symptoms are due to dehydration/ETOH. Advised on return precautions   [KM]    Clinical Course User Index [KM] Kristine Royal   MDM Rules/Calculators/A&P                      Based on review of vitals, medical screening exam, lab work and/or imaging, there does not appear to be an acute, emergent etiology for the patient's symptoms. Counseled pt on good return precautions and encouraged both PCP and ED follow-up as needed.  Prior to discharge, I also discussed incidental imaging findings with patient in detail and advised appropriate, recommended follow-up in detail.  Clinical Impression: 1. Dizziness     Disposition: Discharge  Prior to providing a prescription for a controlled substance, I independently reviewed the patient's recent prescription history on the [facility]. The patient had no recent or regular prescriptions and was deemed appropriate for a brief, less than 3 day prescription of narcotic for acute analgesia.  This note was prepared with assistance of Systems analyst. Occasional wrong-word or  sound-a-like substitutions may have occurred due to the inherent limitations of voice recognition software.  Final Clinical Impression(s) / ED Diagnoses Final diagnoses:  Dizziness    Rx / DC Orders ED Discharge Orders    None       Kristine Royal 05/02/19 2049    Alveria Apley, PA-C 05/02/19 2142    Wyvonnia Dusky, MD 05/02/19 [phone]

## 2019-05-02 NOTE — Discharge Instructions (Addendum)
Thank you for allowing me to care for you today. Please return to the emergency department if you have new or worsening symptoms. Take your medications as instructed.  ° °

## 2021-10-03 IMAGING — CT CT HEAD W/O CM
3 series · 15 of 47 positions shown, 18 images · non-contrast
Comparison: 05/01/2018

CLINICAL DATA: Alcohol abuse, dizziness

EXAM:
CT HEAD WITHOUT CONTRAST
TECHNIQUE: Contiguous axial images were obtained from the base of the skull
through the vertex without intravenous contrast.

[Series 2: head wo · axial · 0.44mm/px · z∈[+588,+728]mm · 9 of 34 slices shown, 12 images]
[im 3/34  brain]
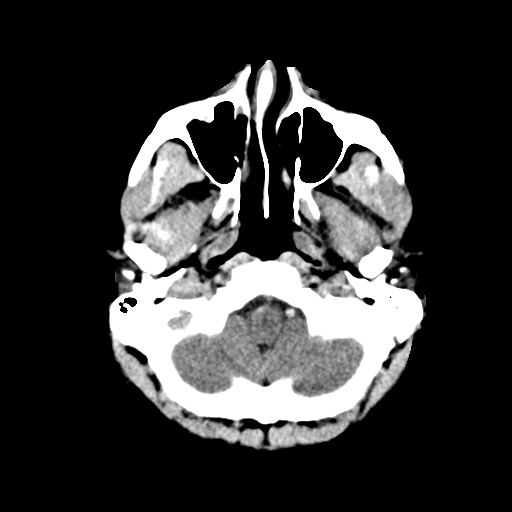
[im 3/34  bone]
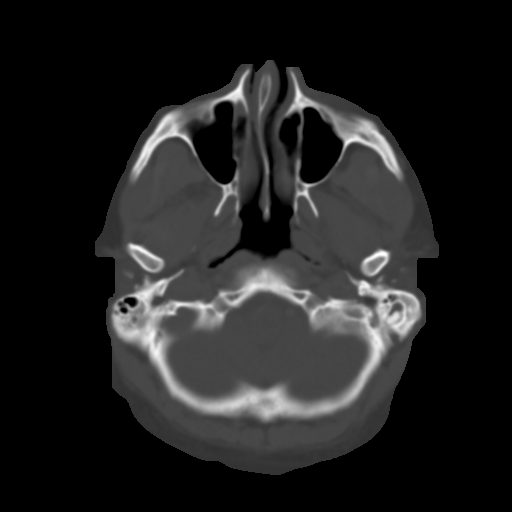
[im 6/34  brain]
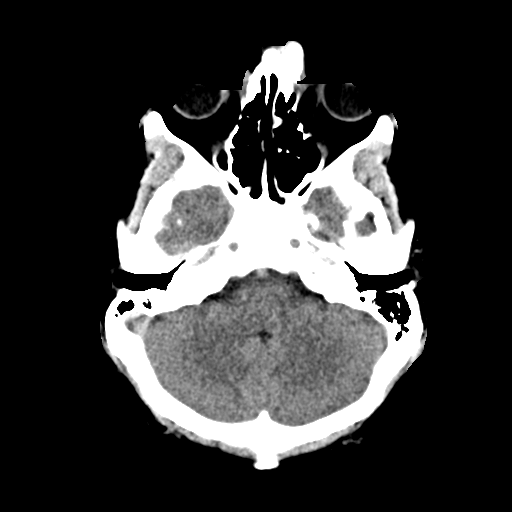
[im 10/34  brain]
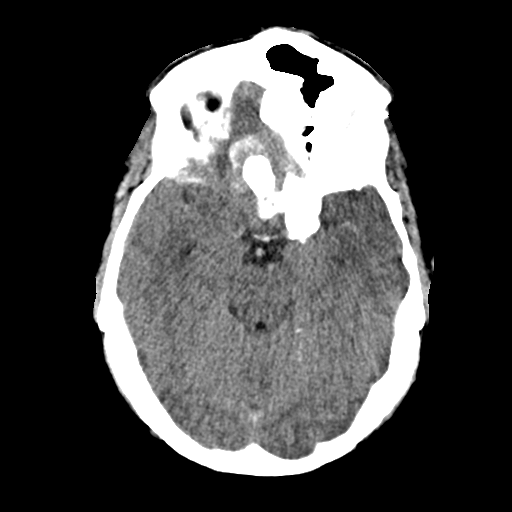
[im 13/34  brain]
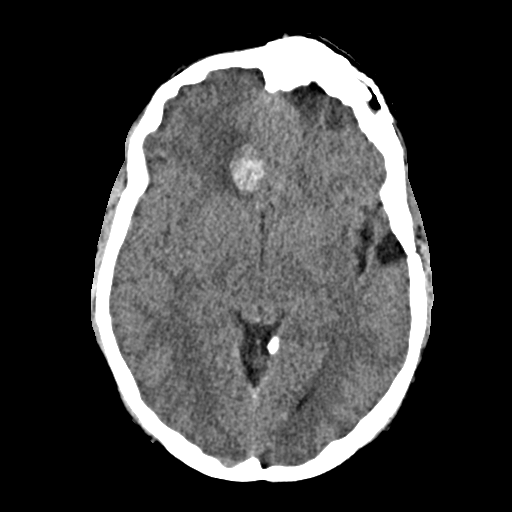
[im 18/34  brain]
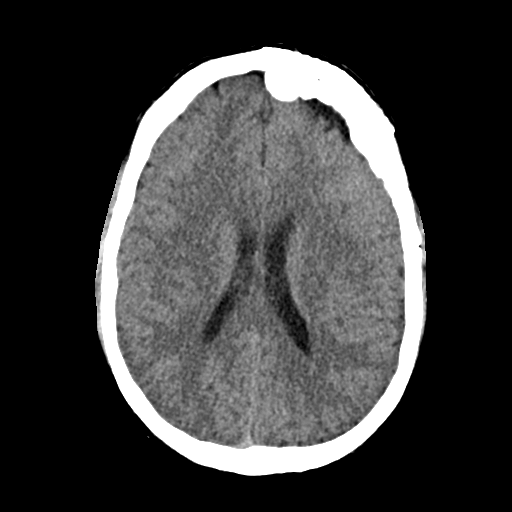
[im 18/34  bone]
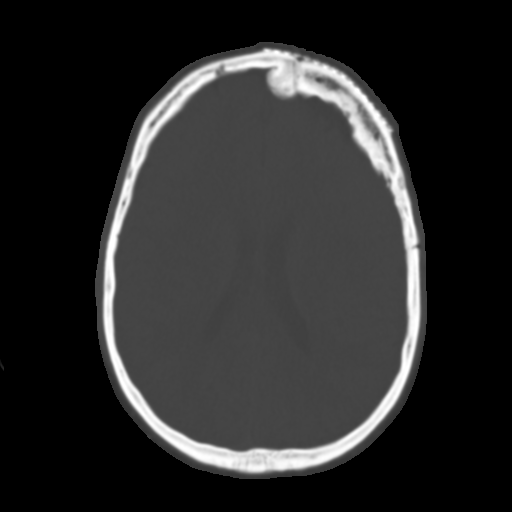
[im 21/34  brain]
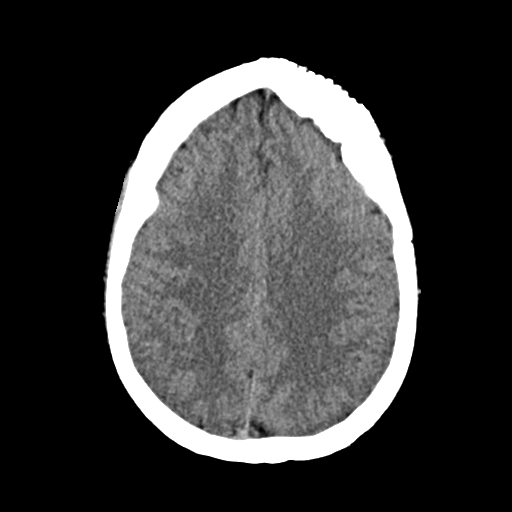
[im 24/34  brain]
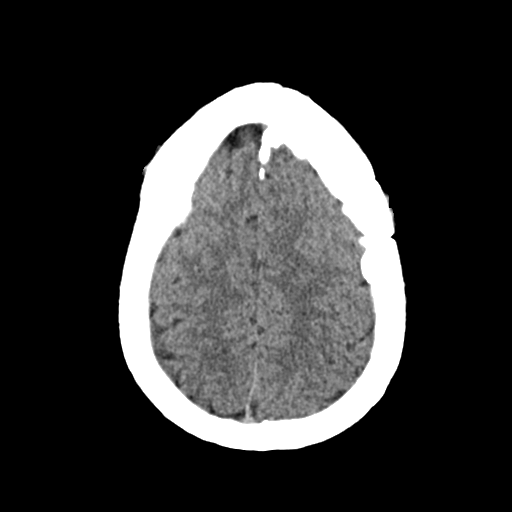
[im 28/34  brain]
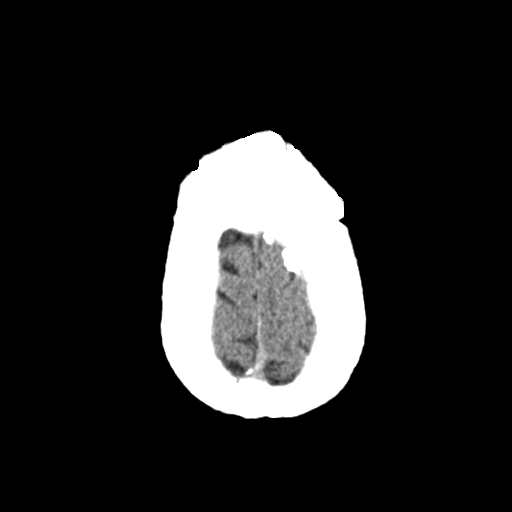
[im 31/34  brain]
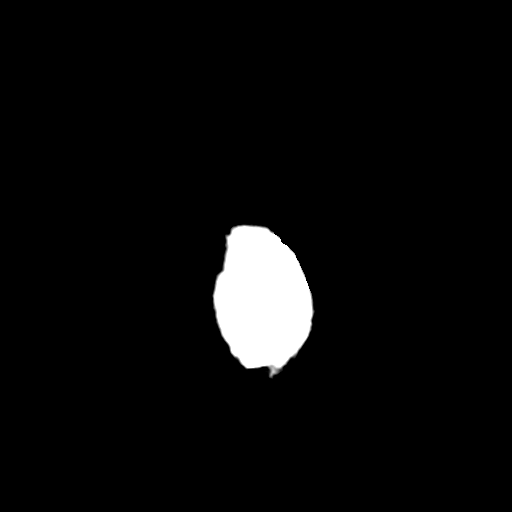
[im 31/34  bone]
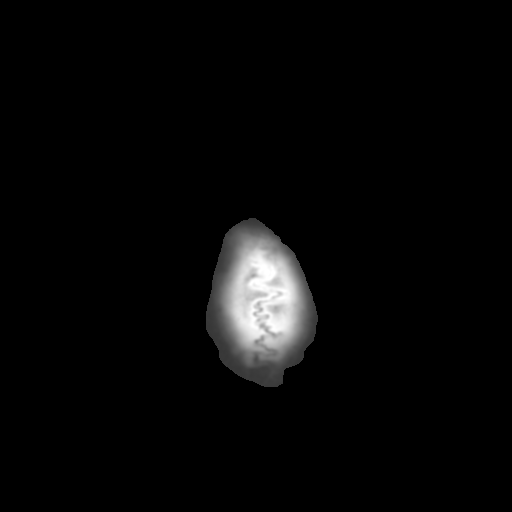

[Series 4: coronal soft · coronal · 0.34mm/px · 3 of 72 slices shown]
[im 24/72  brain]
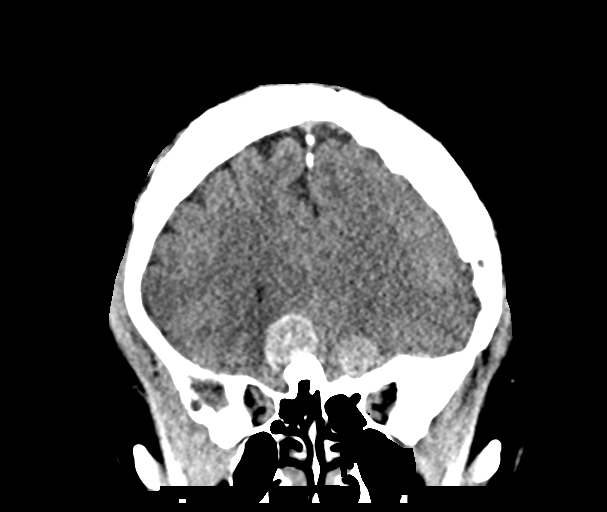
[im 32/72  brain]
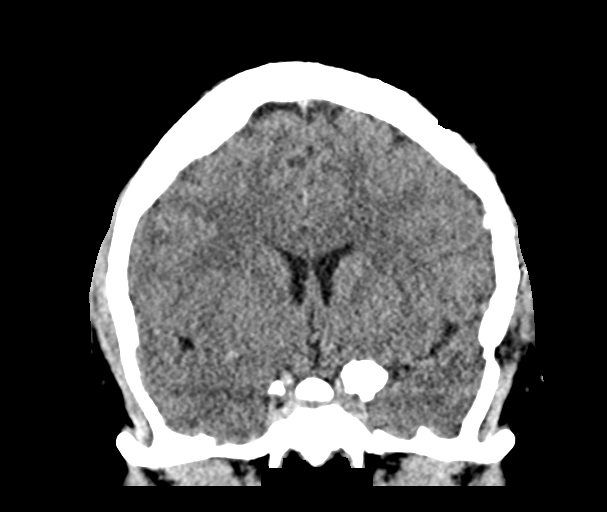
[im 40/72  brain]
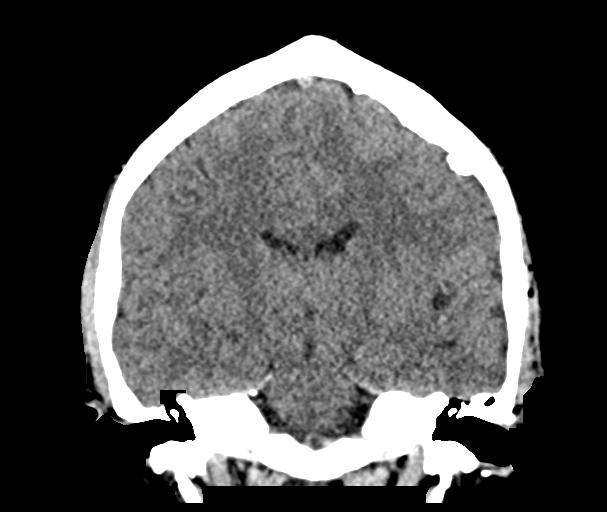

[Series 5: sag soft · sagittal · 0.33mm/px · 3 of 58 slices shown]
[im 20/58  brain]
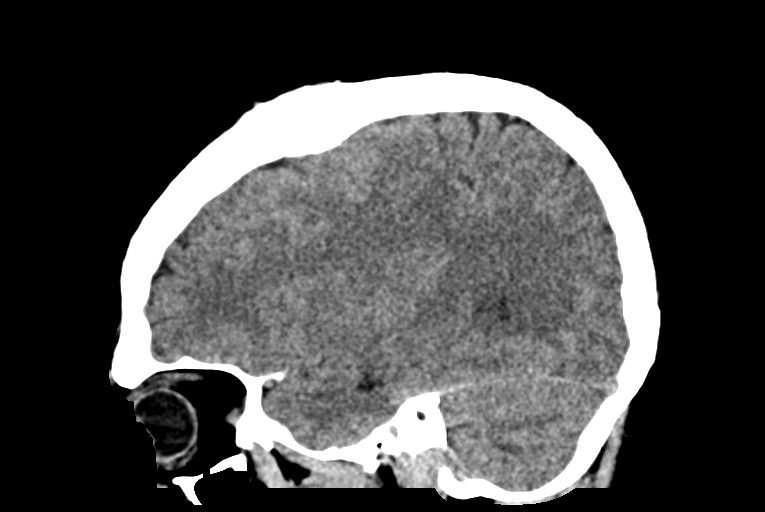
[im 29/58  brain]
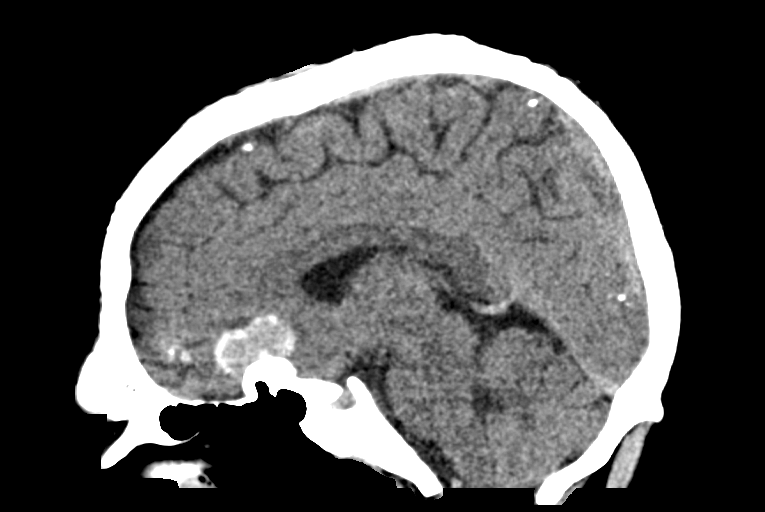
[im 39/58  brain]
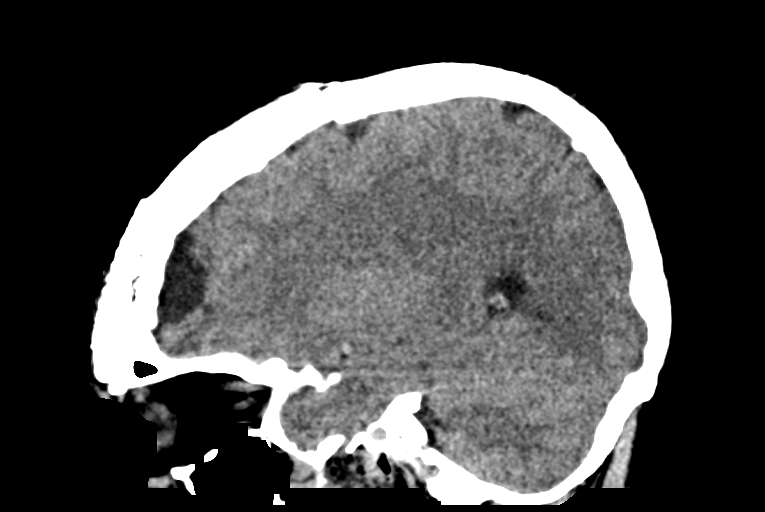

[15 of 47 positions shown; findings below may reference images not displayed]

FINDINGS: Brain: No acute infarct or hemorrhage. Chronic postsurgical
encephalomalacia left frontal lobe. Lateral ventricles and midline
structures are unremarkable. No acute extra-axial fluid collections.

Calcified meningiomas along the floor the anterior cranial fossa not
appreciably different than previous study, largest measuring 2.8 x
1.8 cm.

Vascular: No hyperdense vessel or unexpected calcification.

Skull: Postsurgical changes from prior left frontal craniotomy,
stable. No acute bony abnormality.

Sinuses/Orbits: No acute finding.

Other: None
IMPRESSION: 1. No acute infarct or hemorrhage.
2. Stable postsurgical changes with left frontal lobe
encephalomalacia.
3. Stable meningiomas arising from the floor the anterior cranial
fossa.

## 2022-05-10 ENCOUNTER — Encounter (INDEPENDENT_AMBULATORY_CARE_PROVIDER_SITE_OTHER): Payer: Self-pay

## 2022-08-25 LAB — COLOGUARD: COLOGUARD: NEGATIVE

## 2024-02-24 ENCOUNTER — Other Ambulatory Visit: Payer: Self-pay

## 2024-02-24 ENCOUNTER — Emergency Department (HOSPITAL_BASED_OUTPATIENT_CLINIC_OR_DEPARTMENT_OTHER)
Admission: EM | Admit: 2024-02-24 | Discharge: 2024-02-24 | Disposition: A | Discharge status: Home / Self Care | Source: Home / Self Care | Attending: Emergency Medicine | Admitting: Emergency Medicine

## 2024-02-24 DIAGNOSIS — J101 Influenza due to other identified influenza virus with other respiratory manifestations: Secondary | ICD-10-CM | POA: Insufficient documentation

## 2024-02-24 DIAGNOSIS — R221 Localized swelling, mass and lump, neck: Secondary | ICD-10-CM | POA: Diagnosis present

## 2024-02-24 DIAGNOSIS — Z79899 Other long term (current) drug therapy: Secondary | ICD-10-CM | POA: Diagnosis not present

## 2024-02-24 DIAGNOSIS — I10 Essential (primary) hypertension: Secondary | ICD-10-CM | POA: Diagnosis not present

## 2024-02-24 DIAGNOSIS — Z85841 Personal history of malignant neoplasm of brain: Secondary | ICD-10-CM | POA: Diagnosis not present

## 2024-02-24 DIAGNOSIS — K1120 Sialoadenitis, unspecified: Secondary | ICD-10-CM | POA: Diagnosis not present

## 2024-02-24 DIAGNOSIS — D329 Benign neoplasm of meninges, unspecified: Secondary | ICD-10-CM | POA: Diagnosis not present

## 2024-02-24 DIAGNOSIS — K219 Gastro-esophageal reflux disease without esophagitis: Secondary | ICD-10-CM | POA: Diagnosis not present

## 2024-02-24 LAB — RESP PANEL BY RT-PCR (RSV, FLU A&B, COVID)  RVPGX2
Influenza A by PCR: POSITIVE — AB
Influenza B by PCR: NEGATIVE
Resp Syncytial Virus by PCR: NEGATIVE
SARS Coronavirus 2 by RT PCR: NEGATIVE

## 2024-02-24 MED ORDER — LIDOCAINE VISCOUS HCL 2 % MT SOLN
15.0000 mL | Freq: Once | OROMUCOSAL | Status: AC
  Administered 2024-02-24: 15 mL via OROMUCOSAL
  Filled 2024-02-24: qty 15

## 2024-02-24 MED ORDER — LIDOCAINE VISCOUS HCL 2 % MT SOLN: 15.0000 mL | Freq: Four times a day (QID) | OROMUCOSAL | 0 refills | Status: AC | PRN

## 2024-02-24 MED ORDER — IBUPROFEN 800 MG PO TABS
800.0000 mg | ORAL_TABLET | Freq: Once | ORAL | Status: AC
  Administered 2024-02-24: 800 mg via ORAL
  Filled 2024-02-24: qty 1

## 2024-02-24 MED ORDER — BENZONATATE 100 MG PO CAPS: 100.0000 mg | ORAL_CAPSULE | Freq: Three times a day (TID) | ORAL | 0 refills | Status: AC

## 2024-02-24 NOTE — ED Provider Notes (Signed)
 " [place] [facility] AT [facility] Provider Note   CSN: [id number] Arrival date & time: 02/24/24  1045     Patient presents with: Sore Throat and Generalized Body Aches   David Contreras is a 48 y.o. male who presents to the emergency department with a chief complaint of bodyaches, chills, mild cough, sore throat, as well as loss of taste for approximately a day and a half.  Patient recently followed up with neurosurgery as he has a history of multiple meningiomas.  Recently finished a 7 to 8-day course of dexamethasone  which was prescribed to attempt to help with his headaches.  Otherwise states that his meningiomas are under control and he is supposed to follow-up in 1 year for repeat MRI.  Denies chest pain, shortness of breath, nausea/vomiting/diarrhea.  Patient states he has had sick contacts at home as well as work.  Denies prescription medications other than lisinopril  and atorvastatin .  Past medical history significant for hypertension, hyperlipidemia, multiple meningiomas, GERD, headaches, etc.    Sore Throat       Prior to Admission medications  Medication Sig Start Date End Date Taking? Authorizing Provider  benzonatate  (TESSALON ) 100 MG capsule Take 1 capsule (100 mg total) by mouth every 8 (eight) hours. 02/24/24  Yes Josias Tomerlin F, PA-C  lidocaine  (XYLOCAINE ) 2 % solution Use as directed 15 mLs in the mouth or throat every 6 (six) hours as needed for mouth pain. 02/24/24  Yes Aizlyn Schifano F, PA-C  dexamethasone  (DECADRON ) 4 MG tablet Take 1 tablet (4 mg total) by mouth 2 (two) times daily. Patient not taking: Reported on 05/01/2018 06/06/17   Unice Pac, MD  famotidine  (PEPCID ) 20 MG tablet Take 1 tablet (20 mg total) by mouth 2 (two) times daily. 01/12/18   Mortis, Marry I, PA-C  fluticasone  (FLONASE ) 50 MCG/ACT nasal spray Place 1 spray into both nostrils daily.    [provider]  HYDROcodone -acetaminophen  (NORCO/VICODIN) 5-325  MG tablet Take 1-2 tablets by mouth every 4 (four) hours as needed for moderate pain. Patient not taking: Reported on 05/01/2018 06/06/17   Unice Pac, MD  Multiple Vitamin (MULTIVITAMIN WITH MINERALS) TABS tablet Take 1 tablet by mouth daily.    [provider]  omeprazole  (PRILOSEC  OTC) 20 MG tablet Take 20 mg by mouth daily.     [provider]    Allergies: Chicken allergy    Review of Systems  Constitutional:  Positive for fatigue.    Updated Vital Signs BP 129/69 (BP Location: Right Arm)   Pulse 84   Temp 99.9 F (37.7 C) (Temporal)   Resp 18   SpO2 97%   Physical Exam Vitals and nursing note reviewed.  Constitutional:      General: He is awake. He is not in acute distress.    Appearance: He is well-developed. He is not ill-appearing, toxic-appearing or diaphoretic.  HENT:     Head: Normocephalic and atraumatic.     Right Ear: Tympanic membrane and ear canal normal. There is no impacted cerumen. Tympanic membrane is not erythematous.     Left Ear: Tympanic membrane and ear canal normal. There is no impacted cerumen. Tympanic membrane is not erythematous.     Nose: Congestion present.     Mouth/Throat:     Mouth: Mucous membranes are moist. No oral lesions.     Dentition: No dental abscesses.     Tongue: No lesions. Tongue does not deviate from midline.     Pharynx: Uvula midline.  No pharyngeal swelling, oropharyngeal exudate, posterior oropharyngeal erythema or uvula swelling.     Tonsils: No tonsillar exudate or tonsillar abscesses.  Eyes:     General: No visual field deficit or scleral icterus.    Conjunctiva/sclera: Conjunctivae normal.  Neck:     Comments: No stiff neck, patient able to look left, right, touch chin to chest, and look up at the ceiling without discomfort Cardiovascular:     Rate and Rhythm: Normal rate and regular rhythm.  Pulmonary:     Effort: Pulmonary effort is normal. No respiratory distress.     Breath sounds: No stridor.  No wheezing, rhonchi or rales.  Musculoskeletal:        General: Normal range of motion.     Cervical back: Normal range of motion.     Right lower leg: No edema.     Left lower leg: No edema.     Comments: Grip strength equal bilaterally, grossly normal ROM of all 4 extremities, ambulatory without assistance  Lymphadenopathy:     Cervical: Cervical adenopathy present.  Skin:    General: Skin is warm.     Capillary Refill: Capillary refill takes less than 2 seconds.  Neurological:     General: No focal deficit present.     Mental Status: He is alert and oriented to person, place, and time.     GCS: GCS eye subscore is 4. GCS verbal subscore is 5. GCS motor subscore is 6.     Cranial Nerves: No dysarthria or facial asymmetry.     Sensory: Sensation is intact.     Motor: Motor function is intact.     Coordination: Coordination is intact.     Gait: Gait is intact.  Psychiatric:        Mood and Affect: Mood normal.        Behavior: Behavior normal. Behavior is cooperative.     (all labs ordered are listed, but only abnormal results are displayed) Labs Reviewed  RESP PANEL BY RT-PCR (RSV, FLU A&B, COVID)  RVPGX2 - Abnormal; Notable for the following components:      Result Value   Influenza A by PCR POSITIVE (*)    All other components within normal limits    EKG: None  Radiology: No results found.   Procedures   Medications Ordered in the ED  lidocaine  (XYLOCAINE ) 2 % viscous mouth solution 15 mL (15 mLs Mouth/Throat Given 02/24/24 1434)  ibuprofen  (ADVIL ) tablet 800 mg (800 mg Oral Given 02/24/24 1434)                                    Medical Decision Making Risk Prescription drug management.   Patient presents to the ED for concern of URI-like symptoms, this involves an extensive number of treatment options, and is a complaint that carries with it a high risk of complications and morbidity.  The differential diagnosis includes Covid, Flu, RSV, pneumonia,  etc.   Co morbidities that complicate the patient evaluation  hypertension, hyperlipidemia, multiple meningiomas, GERD, headaches   Additional history obtained:  Reviewed most recent neurosurgery appointment from 02/13/2024 where patient was following up for multiple meningiomas, at this time patient was prescribed steroid taper to help with recurrent headaches, otherwise seem to be doing well, scheduled for follow-up in 1 year with repeat MRI   Lab Tests:  I Ordered, and personally interpreted labs.  The pertinent results include: Influenza A  positive   Medicines ordered and prescription drug management:  I ordered medication including ibuprofen  for body aches, chills, fever, viscous lidocaine  for oral pain/throat pain Reevaluation of the patient after these medicines showed that the patient improved I have reviewed the patients home medicines and have made adjustments as needed   Test Considered:  Chest x-ray: Deferred at this time as patient has no shortness of breath, no abnormal lung sounds with auscultation   Critical Interventions:  None   Problem List / ED Course:  48 year old male, presents to the emergency department with a chief complaint of bodyaches, chills, mild cough, sore throat, loss of taste for 1-1/2 days Recently finished course of dexamethasone  for brain tumor swelling, was instructed by neurosurgery that loss of taste could be side effect of recent steroid use Physical exam reassuring, no focal neuro-deficit, no oral lesions, no uvula deviation Testing today positive for Influenza A  Patient educated on work-up and findings, instructed on symptomatic treatment Loss of taste may be side effect of dexamethasone  vs. onset of new viral infection Return precautions given Patient discharged with outpatient symptomatic treatment instructions Most likely diagnosis at this time is influenza A, physical exam not consistent with meningitis, doubt pneumonia as  patient has no shortness of breath or abnormal lung sounds with auscultation, vitals are stable and patient is clinically well-appearing     Reevaluation:  After the interventions noted above, I reevaluated the patient and found that they have :stayed the same   Social Determinants of Health:  none   Dispostion:  After consideration of the diagnostic results and the patients response to treatment, I feel that the patient would benefit from discharge and outpatient therapy as described.      Final diagnoses:  Influenza A    ED Discharge Orders          Ordered    benzonatate  (TESSALON ) 100 MG capsule  Every 8 hours        02/24/24 1442    lidocaine  (XYLOCAINE ) 2 % solution  Every 6 hours PRN        02/24/24 1442               Alvenia Treese F, PA-C 02/24/24 2208  "

## 2024-02-24 NOTE — ED Triage Notes (Signed)
 Pt reports diffuse body aches, chills, mild cough, sore throat, loss of taste for 1.5 days.  Pt recently finished 7-8 day course of dexamethasone  for brain tumor swelling.  Pt denies SOB, n/v/d.  AAOx4, NAD in triage.

## 2024-02-24 NOTE — Discharge Instructions (Addendum)
 It was a pleasure taking care of you today.  Based on your history, physical exam, as well as your labs I feel you are safe for discharge.  Today you tested positive for influenza A.  The flu is a virus and typically lasts 7 to 14 days.  Recommend over-the-counter Tylenol  and ibuprofen  to help with symptoms.  Also recommend Zyrtec or Claritin  to help with allergy symptoms, you may also use Flonase  which is a nasal spray to help with congestion.  I have sent in a prescription for Tessalon  Perles which is a cough medication and have also sent in a prescription for viscous lidocaine  which you may use for mouth and throat pain.  Please take these medications as prescribed.  If you experience any of the following symptoms including but not limited to refractory fever, chest pain, shortness of breath, neurologic change, severe headache, weakness, or other concerning symptom please return to the emergency department or seek further medical care.  Recommend follow-up with your primary care provider within the next 1 to 2 weeks for recheck, sooner if symptoms warrant. It is possible that your loss of taste is due to this new viral infection or recently taking dexamethasone .

## 2024-02-26 ENCOUNTER — Emergency Department (HOSPITAL_BASED_OUTPATIENT_CLINIC_OR_DEPARTMENT_OTHER)

## 2024-02-26 ENCOUNTER — Encounter (HOSPITAL_BASED_OUTPATIENT_CLINIC_OR_DEPARTMENT_OTHER): Payer: Self-pay

## 2024-02-26 ENCOUNTER — Observation Stay (HOSPITAL_BASED_OUTPATIENT_CLINIC_OR_DEPARTMENT_OTHER)
Admission: EM | Admit: 2024-02-26 | Discharge: 2024-02-28 | Disposition: A | Discharge status: Home / Self Care | Attending: Student in an Organized Health Care Education/Training Program | Admitting: Student in an Organized Health Care Education/Training Program

## 2024-02-26 ENCOUNTER — Other Ambulatory Visit: Payer: Self-pay

## 2024-02-26 DIAGNOSIS — Z743 Need for continuous supervision: Secondary | ICD-10-CM | POA: Diagnosis not present

## 2024-02-26 DIAGNOSIS — D429 Neoplasm of uncertain behavior of meninges, unspecified: Secondary | ICD-10-CM | POA: Diagnosis present

## 2024-02-26 DIAGNOSIS — I10 Essential (primary) hypertension: Secondary | ICD-10-CM

## 2024-02-26 DIAGNOSIS — R221 Localized swelling, mass and lump, neck: Secondary | ICD-10-CM | POA: Diagnosis present

## 2024-02-26 DIAGNOSIS — J101 Influenza due to other identified influenza virus with other respiratory manifestations: Secondary | ICD-10-CM | POA: Insufficient documentation

## 2024-02-26 DIAGNOSIS — K219 Gastro-esophageal reflux disease without esophagitis: Secondary | ICD-10-CM | POA: Diagnosis not present

## 2024-02-26 DIAGNOSIS — Z86011 Personal history of benign neoplasm of the brain: Secondary | ICD-10-CM | POA: Diagnosis not present

## 2024-02-26 DIAGNOSIS — R609 Edema, unspecified: Secondary | ICD-10-CM | POA: Diagnosis not present

## 2024-02-26 DIAGNOSIS — K1120 Sialoadenitis, unspecified: Secondary | ICD-10-CM | POA: Insufficient documentation

## 2024-02-26 DIAGNOSIS — Z79899 Other long term (current) drug therapy: Secondary | ICD-10-CM | POA: Insufficient documentation

## 2024-02-26 DIAGNOSIS — Z85841 Personal history of malignant neoplasm of brain: Secondary | ICD-10-CM | POA: Insufficient documentation

## 2024-02-26 DIAGNOSIS — D329 Benign neoplasm of meninges, unspecified: Secondary | ICD-10-CM | POA: Insufficient documentation

## 2024-02-26 LAB — CBC WITH DIFFERENTIAL/PLATELET
Abs Immature Granulocytes: 0.02 K/uL (ref 0.00–0.07)
Basophils Absolute: 0 K/uL (ref 0.0–0.1)
Basophils Relative: 0 %
Eosinophils Absolute: 0.1 K/uL (ref 0.0–0.5)
Eosinophils Relative: 2 %
HCT: 45.4 % (ref 39.0–52.0)
Hemoglobin: 15.6 g/dL (ref 13.0–17.0)
Immature Granulocytes: 0 %
Lymphocytes Relative: 15 %
Lymphs Abs: 1.2 K/uL (ref 0.7–4.0)
MCH: 29.5 pg (ref 26.0–34.0)
MCHC: 34.4 g/dL (ref 30.0–36.0)
MCV: 86 fL (ref 80.0–100.0)
Monocytes Absolute: 0.5 K/uL (ref 0.1–1.0)
Monocytes Relative: 6 %
Neutro Abs: 6.2 K/uL (ref 1.7–7.7)
Neutrophils Relative %: 77 %
Platelets: 215 K/uL (ref 150–400)
RBC: 5.28 MIL/uL (ref 4.22–5.81)
RDW: 12.8 % (ref 11.5–15.5)
WBC: 8.1 K/uL (ref 4.0–10.5)
nRBC: 0 % (ref 0.0–0.2)

## 2024-02-26 LAB — BASIC METABOLIC PANEL WITH GFR
Anion gap: 10 (ref 5–15)
BUN: 14 mg/dL (ref 6–20)
CO2: 27 mmol/L (ref 22–32)
Calcium: 9.4 mg/dL (ref 8.9–10.3)
Chloride: 101 mmol/L (ref 98–111)
Creatinine, Ser: 0.78 mg/dL (ref 0.61–1.24)
GFR, Estimated: >60 mL/min
Glucose, Bld: 124 mg/dL — ABNORMAL HIGH (ref 70–99)
Potassium: 4.6 mmol/L (ref 3.5–5.1)
Sodium: 137 mmol/L (ref 135–145)

## 2024-02-26 LAB — CBC
HCT: 47.7 % (ref 39.0–52.0)
Hemoglobin: 16.1 g/dL (ref 13.0–17.0)
MCH: 29.1 pg (ref 26.0–34.0)
MCHC: 33.8 g/dL (ref 30.0–36.0)
MCV: 86.3 fL (ref 80.0–100.0)
Platelets: 225 K/uL (ref 150–400)
RBC: 5.53 MIL/uL (ref 4.22–5.81)
RDW: 12.7 % (ref 11.5–15.5)
WBC: 6.1 K/uL (ref 4.0–10.5)
nRBC: 0 % (ref 0.0–0.2)

## 2024-02-26 LAB — HIV ANTIBODY (ROUTINE TESTING W REFLEX): HIV Screen 4th Generation wRfx: NONREACTIVE

## 2024-02-26 LAB — CREATININE, SERUM
Creatinine, Ser: 0.88 mg/dL (ref 0.61–1.24)
GFR, Estimated: >60 mL/min

## 2024-02-26 MED ORDER — ONDANSETRON HCL 4 MG/2ML IJ SOLN: 4.0000 mg | Freq: Four times a day (QID) | INTRAMUSCULAR | Status: DC | PRN

## 2024-02-26 MED ORDER — ONDANSETRON HCL 4 MG PO TABS: 4.0000 mg | ORAL_TABLET | Freq: Four times a day (QID) | ORAL | Status: DC | PRN

## 2024-02-26 MED ORDER — IOHEXOL 300 MG/ML  SOLN
75.0000 mL | Freq: Once | INTRAMUSCULAR | Status: AC | PRN
  Administered 2024-02-26: 75 mL via INTRAVENOUS

## 2024-02-26 MED ORDER — POLYETHYLENE GLYCOL 3350 17 G PO PACK: 17.0000 g | PACK | Freq: Every day | ORAL | Status: DC | PRN

## 2024-02-26 MED ORDER — SODIUM CHLORIDE 0.9 % IV SOLN
3.0000 g | Freq: Once | INTRAVENOUS | Status: AC
  Administered 2024-02-26: 3 g via INTRAVENOUS

## 2024-02-26 MED ORDER — OXYCODONE HCL 5 MG PO TABS: 5.0000 mg | ORAL_TABLET | ORAL | Status: DC | PRN

## 2024-02-26 MED ORDER — SODIUM CHLORIDE 0.9 % IV SOLN
3.0000 g | Freq: Four times a day (QID) | INTRAVENOUS | Status: DC
  Administered 2024-02-26 – 2024-02-28 (×8): 3 g via INTRAVENOUS
  Filled 2024-02-26 (×6): qty 8

## 2024-02-26 MED ORDER — HYDRALAZINE HCL 20 MG/ML IJ SOLN: 10.0000 mg | Freq: Three times a day (TID) | INTRAMUSCULAR | Status: DC | PRN

## 2024-02-26 MED ORDER — ACETAMINOPHEN 325 MG PO TABS: 650.0000 mg | ORAL_TABLET | Freq: Four times a day (QID) | ORAL | Status: DC | PRN

## 2024-02-26 MED ORDER — BENZONATATE 100 MG PO CAPS
200.0000 mg | ORAL_CAPSULE | Freq: Three times a day (TID) | ORAL | Status: DC | PRN
  Administered 2024-02-26 – 2024-02-28 (×3): 200 mg via ORAL
  Filled 2024-02-26 (×3): qty 2

## 2024-02-26 MED ORDER — LIDOCAINE VISCOUS HCL 2 % MT SOLN: 15.0000 mL | Freq: Four times a day (QID) | OROMUCOSAL | Status: DC | PRN

## 2024-02-26 MED ORDER — ADULT MULTIVITAMIN W/MINERALS CH
1.0000 | ORAL_TABLET | Freq: Every day | ORAL | Status: DC
  Administered 2024-02-26 – 2024-02-28 (×3): 1 via ORAL
  Filled 2024-02-26 (×3): qty 1

## 2024-02-26 MED ORDER — ALBUTEROL SULFATE (2.5 MG/3ML) 0.083% IN NEBU: 2.5000 mg | INHALATION_SOLUTION | RESPIRATORY_TRACT | Status: DC | PRN

## 2024-02-26 MED ORDER — ATORVASTATIN CALCIUM 40 MG PO TABS
40.0000 mg | ORAL_TABLET | Freq: Every day | ORAL | Status: DC
  Administered 2024-02-27 – 2024-02-28 (×2): 40 mg via ORAL
  Filled 2024-02-26 (×2): qty 1

## 2024-02-26 MED ORDER — ACETAMINOPHEN 650 MG RE SUPP: 650.0000 mg | Freq: Four times a day (QID) | RECTAL | Status: DC | PRN

## 2024-02-26 MED ORDER — ACETAMINOPHEN 325 MG PO TABS
650.0000 mg | ORAL_TABLET | Freq: Four times a day (QID) | ORAL | Status: DC | PRN
  Administered 2024-02-26 – 2024-02-27 (×4): 650 mg via ORAL
  Filled 2024-02-26 (×4): qty 2

## 2024-02-26 MED ORDER — PANTOPRAZOLE SODIUM 20 MG PO TBEC
20.0000 mg | DELAYED_RELEASE_TABLET | Freq: Every day | ORAL | Status: DC
  Administered 2024-02-26 – 2024-02-28 (×3): 20 mg via ORAL
  Filled 2024-02-26 (×3): qty 1

## 2024-02-26 MED ORDER — FLUTICASONE PROPIONATE 50 MCG/ACT NA SUSP
1.0000 | Freq: Every day | NASAL | Status: DC
  Administered 2024-02-26 – 2024-02-28 (×3): 1 via NASAL
  Filled 2024-02-26: qty 16

## 2024-02-26 MED ORDER — ENOXAPARIN SODIUM 60 MG/0.6ML IJ SOSY
50.0000 mg | PREFILLED_SYRINGE | INTRAMUSCULAR | Status: DC
  Administered 2024-02-26 – 2024-02-27 (×2): 50 mg via SUBCUTANEOUS
  Filled 2024-02-26 (×2): qty 0.6

## 2024-02-26 MED ORDER — OSELTAMIVIR PHOSPHATE 75 MG PO CAPS
75.0000 mg | ORAL_CAPSULE | Freq: Two times a day (BID) | ORAL | Status: DC
  Administered 2024-02-26 – 2024-02-28 (×4): 75 mg via ORAL
  Filled 2024-02-26 (×4): qty 1

## 2024-02-26 MED ORDER — LISINOPRIL 20 MG PO TABS
20.0000 mg | ORAL_TABLET | Freq: Every day | ORAL | Status: DC
  Administered 2024-02-27 – 2024-02-28 (×2): 20 mg via ORAL
  Filled 2024-02-26 (×2): qty 1

## 2024-02-26 NOTE — Consult Note (Signed)
 ENT eConsult   Consulting Specialist: Penne Croak, DO Specialty: Otolaryngology-Head & Neck Surgery Requesting Provider: Dr. Edsel Dade Date of Review: 02/26/2024 Time Spent: 15 minutes (reviewing records/images + composing written report) CPT: [phone] (Interprofessional consult with written report, no patient contact)  Reason for Consultation - asymmetric SMG swelling, sialoadenitis   CT reviewed - left SMG hypertrophy with surrounding fat stranding. No stones or abscess.   Chief Complaint  Patient presents with   neck swelling   Left submandibular gland sialadenitis with no abscess or stone  Recommend medical therapy  - Warm compresses q2h while awake - IV antibiotics  - Okay for diet.  - Pain medicine per primary team  Available on [employer] and [employer] Penne Croak 02/26/2024 2:36 PM  Department of Otolaryngology Contact Info: Otolaryngology Front Desk Phone including questions about appointments or questions: [phone] - If after normal business hours (Monday-Friday after 5PM or Weekends/Holidays), please call same number and follow prompts for Patient Access Line. There is a physician on call for urgent matters. For life threatening emergencies, please call 911

## 2024-02-26 NOTE — ED Notes (Signed)
 David Contreras with cl called for [facility]

## 2024-02-26 NOTE — H&P (Signed)
 " History and Physical   Referring Provider: Dr Edsel Dade Telemedicine Provider: GORMAN Applebaum Provider Location: [place] Patient Location: [facility] Referring Diagnosis: Neck Swelling Patient Name and DOB verified: yes Patient consented to Telemedicine Evaluation:yes RN virtual assistant: Edsel Kell Video encounter time and date:02/26/24 at 9:13 am   Patient: David Contreras FMW:[id number] DOB: Nov 10, 1975 DOA: 02/26/2024 DOS: the patient was seen and examined on 02/26/2024 PCP: Mavis Redge SAILOR, FNP    Chief Complaint:  Chief Complaint  Patient presents with   neck swelling   HPI: KODIE KISHI is a 48 y.o. male with medical history significant of HTN, GERD, meningioma (s/p craniotomy 2019, and gamma knife surgery earlier this year) who presented to the ED with left neck swelling.  Per patient-he was recently diagnosed with the flu on 11/22 (ongoing URI symptoms with cough/congestion for 3[facility]4 days[facility]-and was given supportive care and sent home.  Although his cough/congestion is much better, he noticed some swelling in his left neck area last night-and when he woke up noticed that the swelling had worsened overnight-as result he presented to the ED for further evaluation and treatment.  He does not have any fever.  Has only mild pain when swallowing.  He does not have any shortness of breath-he is not wheezing have any stridor.   He was evaluated in the ED-subsequently a CT imaging was done which showed inflamed left submandibular gland with regional cellulitis/inflammation without any obvious abscess.  EDMD discussed with ENT MD Dr. Anice over the phone-requested hospitalization at [facility] for ENT can see him, he was treated with Unasyn .  Subsequent hospitalist service was asked to admit this patient for further evaluation and treatment.   Review of Systems: As mentioned in the history of present illness. All other systems reviewed and are  negative. Past Medical History:  Diagnosis Date   Brain tumor [facility])    Esophageal stricture    Genetic testing 05/31/17   Multi-Cancer panel (83 genes) @ [facility] - No pathogenic mutations detected   GERD (gastroesophageal reflux disease)    Headache    Meningioma (HCC)    [name], multiple (HCC) 2003   Past Surgical History:  Procedure Laterality Date   ANKLE SURGERY     APPLICATION OF CRANIAL NAVIGATION N/A 06/04/2017   Procedure: APPLICATION OF CRANIAL NAVIGATION;  Surgeon: Unice Pac, MD;  Location: [facility] OR;  Service: Neurosurgery;  Laterality: N/A;   BALLOON DILATION N/A 05/03/2016   Procedure: BALLOON DILATION;  Surgeon: Jerrell Sol, MD;  Location: [facility] ENDOSCOPY;  Service: Endoscopy;  Laterality: N/A;   CRANIOTOMY Left 06/04/2017   Procedure: Left frontal and temporal craniotomy for meningioma with removal of osteomas with Brainlab;  Surgeon: Unice Pac, MD;  Location: [facility] OR;  Service: Neurosurgery;  Laterality: Left;  Left frontal and temporal craniotomy for meningioma with removal of osteomas with Brainlab   ESOPHAGOGASTRODUODENOSCOPY N/A 04/26/2016   Procedure: ESOPHAGOGASTRODUODENOSCOPY (EGD);  Surgeon: Jerrell Sol, MD;  Location: [facility];  Service: Endoscopy;  Laterality: N/A;   ESOPHAGOGASTRODUODENOSCOPY (EGD) WITH PROPOFOL  N/A 05/03/2016   Procedure: ESOPHAGOGASTRODUODENOSCOPY (EGD) WITH PROPOFOL ;  Surgeon: Jerrell Sol, MD;  Location: [facility] ENDOSCOPY;  Service: Endoscopy;  Laterality: N/A;   Social History:  reports that he has never smoked. He has never used smokeless tobacco. He reports current alcohol use. He reports that he does not currently use drugs.  Allergies[1]  Family History  Problem Relation Age of Onset   Pancreatic cancer Paternal Grandmother 67  deceased 65    Prior to Admission medications  Medication Sig Start Date End Date Taking? Authorizing Provider  atorvastatin  (LIPITOR) 40 MG tablet Take 40 mg by mouth daily. 03/11/23  Yes  [provider]  DUPIXENT 300 MG/2ML SOAJ Inject 300 mg into the skin once. 03/05/20  Yes [provider]  lisinopril  (ZESTRIL ) 20 MG tablet Take 20 mg by mouth daily. 08/15/23  Yes [provider]  benzonatate  (TESSALON ) 100 MG capsule Take 1 capsule (100 mg total) by mouth every 8 (eight) hours. 02/24/24   Hinnant, Collin F, PA-C  dexamethasone  (DECADRON ) 4 MG tablet Take 1 tablet (4 mg total) by mouth 2 (two) times daily. Patient not taking: Reported on 05/01/2018 06/06/17   Unice Pac, MD  famotidine  (PEPCID ) 20 MG tablet Take 1 tablet (20 mg total) by mouth 2 (two) times daily. 01/12/18   Mortis, Marry I, PA-C  fluticasone  (FLONASE ) 50 MCG/ACT nasal spray Place 1 spray into both nostrils daily.    [provider]  HYDROcodone -acetaminophen  (NORCO/VICODIN) 5-325 MG tablet Take 1-2 tablets by mouth every 4 (four) hours as needed for moderate pain. Patient not taking: Reported on 05/01/2018 06/06/17   Unice Pac, MD  lidocaine  (XYLOCAINE ) 2 % solution Use as directed 15 mLs in the mouth or throat every 6 (six) hours as needed for mouth pain. 02/24/24   Hinnant, Collin F, PA-C  Multiple Vitamin (MULTIVITAMIN WITH MINERALS) TABS tablet Take 1 tablet by mouth daily.    [provider]  omeprazole  (PRILOSEC  OTC) 20 MG tablet Take 20 mg by mouth daily.     [provider]    Physical Exam: Performed at bedside by Edsel Ka Vitals:   02/26/24 9390 02/26/24 0610 02/26/24 0654 02/26/24 0905  BP:  (!) 141/87  131/75  Pulse:  85  67  Resp:  18  20  Temp:  98.4 F (36.9 C)    TempSrc:  Oral    SpO2: 98% 94% 94% 99%  Weight:  108.9 kg    Height:  5' 11 (1.803 m)     Gen Exam:Alert awake-not in any distress.  No stridor.  Appears very comfortable. HEENT:atraumatic, normocephalic-golf ball sized swollen area right below jaw around the submandibular area-with no obvious erythema Chest: B/L clear to auscultation anteriorly CVS:S1S2  regular Abdomen:soft non tender, non distended Extremities:no edema Neurology: Non focal-easily moving all 4 extremities.  Good grip strength. Skin: no rash  Data Reviewed:     Latest Ref Rng & Units 02/26/2024    6:50 AM 05/02/2019    9:03 PM 05/01/2018   12:57 PM  CBC  WBC 4.0 - 10.5 K/uL 8.1  10.7  8.1   Hemoglobin 13.0 - 17.0 g/dL 84.3  83.2  82.5   Hematocrit 39.0 - 52.0 % 45.4  48.5  52.2   Platelets 150 - 400 K/uL 215  301  292         Latest Ref Rng & Units 02/26/2024    6:50 AM 05/02/2019    9:03 PM 05/01/2018   12:57 PM  BMP  Glucose 70 - 99 mg/dL 875  874  93   BUN 6 - 20 mg/dL 14  14  14    Creatinine 0.61 - 1.24 mg/dL 9.21  9.07  9.13   Sodium 135 - 145 mmol/L 137  138  140   Potassium 3.5 - 5.1 mmol/L 4.6  3.7  3.7   Chloride 98 - 111 mmol/L 101  102  103  CO2 22 - 32 mmol/L 27  25  24    Calcium  8.9 - 10.3 mg/dL 9.4  9.7  9.7      Assessment and Plan: Left neck/submandibular area swelling-likely secondary to sialadenitis Likely infectious, no stone seen on CT imaging Appears stable-not in any distress-airway appears patent-no stridor Continue Unasyn  ENT Dr. Metta consulted by ED MD-please notify ENT when patient arrives at [facility].  Recent diagnosis of influenza A Symptoms are much better-appears stable Already out of the window for Tamiflu  Supportive care  HTN BP stable Lisinopril   GERD PPI  History of meningioma-s/p craniotomy 2019-and gamma knife surgery at [facility] health earlier this year Just completed a course of tapering Decadron  (8-9 days) around 5 days back. Follow with neurosurgery at [facility] post discharge   Advance Care Planning:   Code Status: Full Code   Consults: ENT by ED MD  Family Communication: None at bedside  Severity of Illness: The appropriate patient status for this patient is INPATIENT. Inpatient status is judged to be reasonable and necessary in order to provide the  required intensity of service to ensure the patient's safety. The patient's presenting symptoms, physical exam findings, and initial radiographic and laboratory data in the context of their chronic comorbidities is felt to place them at high risk for further clinical deterioration. Furthermore, it is not anticipated that the patient will be medically stable for discharge from the hospital within 2 midnights of admission.   * I certify that at the point of admission it is my clinical judgment that the patient will require inpatient hospital care spanning beyond 2 midnights from the point of admission due to high intensity of service, high risk for further deterioration and high frequency of surveillance required.*  Author: Donalda Applebaum, MD 02/26/2024 9:34 AM  For on call review [email or web address].      [1]  Allergies Allergen Reactions   Chicken Allergy Swelling   "

## 2024-02-26 NOTE — ED Notes (Signed)
Report given to [facility] 

## 2024-02-26 NOTE — ED Triage Notes (Signed)
 Pt was diagnosed with the flu a couple of days ago and said he feels better with that. Now he has some swelling in the left side of his neck that he said is causing difficulty swallowing.

## 2024-02-26 NOTE — ED Provider Notes (Signed)
 "  [place] EMERGENCY DEPARTMENT AT [facility]  Provider Note  CSN: [id number] Arrival date & time: 02/26/24 0601  History Chief Complaint  Patient presents with   neck swelling    David Contreras is a 48 y.o. male history of benign meningiomas managed by Neurosurg with prior resection and radiation, most recently completed a decadron  taper for headaches. He was in the ED 2 days ago for URI symptoms and diagnosed with influenza. He is feeling better from that stand point but last night noticed swelling in his L neck which has worsened overnight. No fevers, mild discomfort with swallowing not trouble breathing. No dental pain.    Home Medications Prior to Admission medications  Medication Sig Start Date End Date Taking? Authorizing Provider  benzonatate  (TESSALON ) 100 MG capsule Take 1 capsule (100 mg total) by mouth every 8 (eight) hours. 02/24/24   Hinnant, Collin F, PA-C  dexamethasone  (DECADRON ) 4 MG tablet Take 1 tablet (4 mg total) by mouth 2 (two) times daily. Patient not taking: Reported on 05/01/2018 06/06/17   Unice Pac, MD  famotidine  (PEPCID ) 20 MG tablet Take 1 tablet (20 mg total) by mouth 2 (two) times daily. 01/12/18   Mortis, Marry I, PA-C  fluticasone  (FLONASE ) 50 MCG/ACT nasal spray Place 1 spray into both nostrils daily.    [provider]  HYDROcodone -acetaminophen  (NORCO/VICODIN) 5-325 MG tablet Take 1-2 tablets by mouth every 4 (four) hours as needed for moderate pain. Patient not taking: Reported on 05/01/2018 06/06/17   Unice Pac, MD  lidocaine  (XYLOCAINE ) 2 % solution Use as directed 15 mLs in the mouth or throat every 6 (six) hours as needed for mouth pain. 02/24/24   Hinnant, Collin F, PA-C  Multiple Vitamin (MULTIVITAMIN WITH MINERALS) TABS tablet Take 1 tablet by mouth daily.    [provider]  omeprazole  (PRILOSEC  OTC) 20 MG tablet Take 20 mg by mouth daily.     [provider]     Allergies    Chicken  allergy   Review of Systems   Review of Systems Please see HPI for pertinent positives and negatives  Physical Exam BP (!) 141/87 (BP Location: Right Arm)   Pulse 85   Temp 98.4 F (36.9 C) (Oral)   Resp 18   Ht 5' 11 (1.803 m)   Wt 108.9 kg   SpO2 94%   BMI 33.47 kg/m   Physical Exam Vitals and nursing note reviewed.  Constitutional:      Appearance: Normal appearance.  HENT:     Head: Normocephalic and atraumatic.     Nose: Nose normal.     Mouth/Throat:     Mouth: Mucous membranes are moist.     Comments: Normal dentition, no sublingual swelling or signs of deep tissue infection Eyes:     Extraocular Movements: Extraocular movements intact.     Conjunctiva/sclera: Conjunctivae normal.  Neck:     Comments: Soft tissue fullness without fluctuance in L submandibular area Cardiovascular:     Rate and Rhythm: Normal rate.  Pulmonary:     Effort: Pulmonary effort is normal.     Breath sounds: Normal breath sounds. No stridor.  Abdominal:     General: Abdomen is flat.     Palpations: Abdomen is soft.     Tenderness: There is no abdominal tenderness.  Musculoskeletal:        General: No swelling. Normal range of motion.     Cervical back: Neck supple.  Skin:    General:  Skin is warm and dry.  Neurological:     General: No focal deficit present.     Mental Status: He is alert.  Psychiatric:        Mood and Affect: Mood normal.     ED Results / Procedures / Treatments   EKG None  Procedures Procedures  Medications Ordered in the ED Medications - No data to display  Initial Impression and Plan  Patient with history as above, here with neck swelling. Likely sialoadenitis, but will check labs and send for CT to evaluate.   ED Course   Clinical Course as of 02/26/24 0655  Wed Feb 26, 2024  0654 Care will be signed out to oncoming team at shift change pending labs and CT.  [CS]    Clinical Course User Index [CS] Roselyn Carlin NOVAK, MD     MDM  Rules/Calculators/A&P Medical Decision Making Problems Addressed: Neck swelling: acute illness or injury  Amount and/or Complexity of Data Reviewed Labs: ordered. Radiology: ordered.     Final Clinical Impression(s) / ED Diagnoses Final diagnoses:  Neck swelling    Rx / DC Orders ED Discharge Orders     None        Roselyn Carlin NOVAK, MD 02/26/24 [phone]  "

## 2024-02-26 NOTE — ED Notes (Signed)
 Assisted Hospitalist with admission Exam.

## 2024-02-26 NOTE — ED Provider Notes (Signed)
" °  Physical Exam  BP (!) 141/87 (BP Location: Right Arm)   Pulse 85   Temp 98.4 F (36.9 C) (Oral)   Resp 18   Ht 1.803 m (5' 11)   Wt 108.9 kg   SpO2 94%   BMI 33.47 kg/m   Physical Exam  Procedures  Procedures  ED Course / MDM   Clinical Course as of 02/26/24 0858  Wed Feb 26, 2024  0654 Care will be signed out to oncoming team at shift change pending labs and CT.  [CS]  [phone] Care discussed with Dr. Anice.  He agrees with plan.  He request patient be admitted to [facility]. [DR]    Clinical Course User Index [CS] Roselyn Carlin NOVAK, MD [DR] Levander Houston, MD   Medical Decision Making Amount and/or Complexity of Data Reviewed Labs: ordered. Radiology: ordered.  Risk Prescription drug management. Decision regarding hospitalization.   48 yo male ho meningioma, recent flu with neck swelling.  CT results pending.   Completed recent steroids for ha.  8:02 AM CT read reviewed with enlarged inflamed left submandibular gland with regional cellulitis and secondary inflammation left submandibular and parapharyngeal spaces with no obvious abscess.  Radiologist notes favors acute infectious sialoadenitis although no sialolithiasis or obstructing etiology identified.  Patient states he has recently had flu.  He has also recently been on steroids.  He went to bed with no problems last night but woke up around 3 AM with swelling in the left submandibular area.  He felt like he was having some difficulty breathing but that feels improved at this time.  He is able to swallow without difficulty. Labs are reviewed and are within normal limits. Patient is afebrile. Patient with significant swelling in left submandibular area.  No apparent intraoral component Plan consult to ENT and hospitalist for admission for observation.  Unasyn  ordered Care discussed with Dr. Anice.  He advises patient should be admitted to [facility] where ENT can see him. Care discussed with Dr. Raenelle who will  see for admission      Levander Houston, MD 02/26/24 [phone]  "

## 2024-02-26 NOTE — Plan of Care (Signed)
 Patient was seen and examined, no apparent distress, no difficulty breathing or airway compromise, discussed with ENT Dr. Anice, will continue with IV antibiotics tonight and reassess tomorrow, started on Tamiflu  for influenza infection. Brayton Lye MD

## 2024-02-27 DIAGNOSIS — K1120 Sialoadenitis, unspecified: Secondary | ICD-10-CM | POA: Diagnosis not present

## 2024-02-27 DIAGNOSIS — R221 Localized swelling, mass and lump, neck: Secondary | ICD-10-CM | POA: Diagnosis not present

## 2024-02-27 LAB — COMPREHENSIVE METABOLIC PANEL WITH GFR
ALT: 41 U/L (ref 0–44)
AST: 28 U/L (ref 15–41)
Albumin: 3.8 g/dL (ref 3.5–5.0)
Alkaline Phosphatase: 63 U/L (ref 38–126)
Anion gap: 10 (ref 5–15)
BUN: 13 mg/dL (ref 6–20)
CO2: 25 mmol/L (ref 22–32)
Calcium: 8.7 mg/dL — ABNORMAL LOW (ref 8.9–10.3)
Chloride: 101 mmol/L (ref 98–111)
Creatinine, Ser: 0.78 mg/dL (ref 0.61–1.24)
GFR, Estimated: >60 mL/min
Glucose, Bld: 114 mg/dL — ABNORMAL HIGH (ref 70–99)
Potassium: 4.2 mmol/L (ref 3.5–5.1)
Sodium: 136 mmol/L (ref 135–145)
Total Bilirubin: 0.8 mg/dL (ref 0.0–1.2)
Total Protein: 6.5 g/dL (ref 6.5–8.1)

## 2024-02-27 LAB — CBC
HCT: 44.1 % (ref 39.0–52.0)
Hemoglobin: 15.3 g/dL (ref 13.0–17.0)
MCH: 29.6 pg (ref 26.0–34.0)
MCHC: 34.7 g/dL (ref 30.0–36.0)
MCV: 85.3 fL (ref 80.0–100.0)
Platelets: 181 K/uL (ref 150–400)
RBC: 5.17 MIL/uL (ref 4.22–5.81)
RDW: 12.8 % (ref 11.5–15.5)
WBC: 5.5 K/uL (ref 4.0–10.5)
nRBC: 0 % (ref 0.0–0.2)

## 2024-02-27 MED ORDER — GUAIFENESIN ER 600 MG PO TB12
600.0000 mg | ORAL_TABLET | Freq: Two times a day (BID) | ORAL | Status: AC
  Administered 2024-02-27 (×2): 600 mg via ORAL
  Filled 2024-02-27 (×2): qty 1

## 2024-02-27 NOTE — Progress Notes (Addendum)
 " PROGRESS NOTE David Contreras    DOB: [birth year], 48 y.o.  FMW:[id number]    Code Status: Full Code   DOA: 02/26/2024   LOS: 1  Brief hospital course  David Contreras is a 48 y.o. male with a PMH significant for HTN, GERD, meningioma (s/p craniotomy 2019, and gamma knife surgery earlier this year) who presented to the ED with left neck swelling. He does not have any fever.  Has only mild pain when swallowing.  He does not have any shortness of breath-he is not wheezing have any stridor.    CT showed inflamed left submandibular gland with regional cellulitis/inflammation without any obvious abscess. ENT, Dr. Anice recommended admission and IV Abx.  he was treated with Unasyn .   02/27/2024 -clinically stable and improved  Assessment & Plan  Principal Problem:   Neck swelling Active Problems:   [name], multiple (HCC)   HTN (hypertension)   GERD (gastroesophageal reflux disease)  Left neck/submandibular area swelling-likely secondary to sialadenitis. Was febrile overnight Likely infectious, no stone seen on CT imaging Appears stable-not in any distress-airway appears patent-no stridor Continue Unasyn  ENT following Supportive care Follow fever curve   Recent diagnosis of influenza A Symptoms are much better-appears stable Already out of the window for Tamiflu  Supportive care   HTN BP stable PTA Lisinopril    GERD PPI   History of meningioma-s/p craniotomy 2019-and gamma knife surgery at [facility] earlier this year Just completed a course of tapering Decadron  (8-9 days) around 5 days back. Follow with neurosurgery at [facility] post discharge  Body mass index is 34.41 kg/m.  VTE ppx: SCDs Start: 02/26/24 1634  Diet:     Diet   DIET SOFT Room service appropriate? Yes; Fluid consistency: Thin   Consultants: ENT  Subjective 02/27/2024    Pt reports doing well. His swallowing has improved and feels that the swelling on left has  lessoned.    Objective  Blood pressure 117/78, pulse 91, temperature 100 F (37.8 C), temperature source Oral, resp. rate 17, height 5' 11 (1.803 m), weight 111.9 kg, SpO2 93%.  Intake/Output Summary (Last 24 hours) at 02/27/2024 0700 Last data filed at 02/26/2024 1407 Gross per 24 hour  Intake 200 ml  Output --  Net 200 ml   Filed Weights   02/26/24 0610 02/26/24 1640  Weight: 108.9 kg 111.9 kg    Physical Exam:  General: awake, alert, NAD HEENT: soft, mildly tender left submandibular swelling. No overlying skin changes. Oropharynx patent, normal voice.  Respiratory: normal respiratory effort. Cardiovascular: extremities well perfused Nervous: A&O x3. no gross focal neurologic deficits, normal speech Extremities: moves all equally, no edema, normal tone Skin: dry, intact, normal temperature, normal color. No rashes, lesions or ulcers on exposed skin Psychiatry: normal mood, congruent affect  Labs   I have personally reviewed the following labs and imaging studies CBC    Component Value Date/Time   WBC 5.5 02/27/2024 0327   RBC 5.17 02/27/2024 0327   HGB 15.3 02/27/2024 0327   HCT 44.1 02/27/2024 0327   PLT 181 02/27/2024 0327   MCV 85.3 02/27/2024 0327   MCH 29.6 02/27/2024 0327   MCHC 34.7 02/27/2024 0327   RDW 12.8 02/27/2024 0327   LYMPHSABS 1.2 02/26/2024 0650   MONOABS 0.5 02/26/2024 0650   EOSABS 0.1 02/26/2024 0650   BASOSABS 0.0 02/26/2024 0650      Latest Ref Rng & Units 02/27/2024    3:27 AM 02/26/2024    5:46 PM  02/26/2024    6:50 AM  BMP  Glucose 70 - 99 mg/dL 885   875   BUN 6 - 20 mg/dL 13   14   Creatinine 9.38 - 1.24 mg/dL 9.21  9.11  9.21   Sodium 135 - 145 mmol/L 136   137   Potassium 3.5 - 5.1 mmol/L 4.2   4.6   Chloride 98 - 111 mmol/L 101   101   CO2 22 - 32 mmol/L 25   27   Calcium  8.9 - 10.3 mg/dL 8.7   9.4     CT Soft Tissue Neck W Contrast Result Date: 02/26/2024 EXAM: CT NECK WITH CONTRAST 02/26/2024 07:35:56 AM TECHNIQUE: CT  of the neck was performed with the administration of 75 mL of iohexol  (OMNIPAQUE ) 300 MG/ML solution. Multiplanar reformatted images are provided for review. Automated exposure control, iterative reconstruction, and/or weight based adjustment of the mA/kV was utilized to reduce the radiation dose to as low as reasonably achievable. COMPARISON: CT head 05/02/2019, brain MRI 06/05/2017. CLINICAL HISTORY: 48 year old male with history of a chronic, treated intracranial meningioma. Recent influenza. New onset left neck swelling. FINDINGS: AERODIGESTIVE TRACT: At the larynx, the glottis is closed. Laryngeal and pharyngeal soft tissue contours are within normal limits. Negative retropharyngeal and right parapharyngeal spaces. SALIVARY GLANDS: The left submandibular gland is asymmetrically enlarged, heterogeneously hyperenhancing, and indistinct (series 3 image 62 and series 5 image 109) with overlying thickening of the platysma and confluent fat stranding in both the subcutaneous and submandibular spaces. The left sublingual space is mostly spared. Confluent secondary inflammation is also present in the left parapharyngeal space at this level. No sialolithiasis or ductal dilatation is identified. The contralateral right submandibular gland is within normal limits. Parotid glands are within normal limits. Sublingual glands are within normal limits. THYROID: Unremarkable. LYMPH NODES: Small reactive appearing left level 1 and level 2 lymph nodes which are mildly hyperenhancing. The largest cervical lymph nodes are 9 to 10 mm short axis bilaterally. No cystic or necrotic nodes. SOFT TISSUES: Overlying thickening of the platysma and confluent fat stranding in both the subcutaneous and submandibular spaces associated with the left submandibular gland abnormality. No soft tissue gas. No discrete fluid collection. BONES: Partially visible lobulated and calcified central skull base enhancing masses compatible with known  meningiomatosis (series 3 image 1). No acute osseous abnormality. No acute dental finding. OTHER: Chronic postoperative changes of the left frontal sinus. Mild bilateral paranasal sinus mucosal thickening is increased. Tympanic cavities remain well aerated. Major vascular structures in the bilateral neck and at the skull base are enhancing and patent including the left internal jugular vein. Negative visible upper chest. Visualized lungs are clear. IMPRESSION: 1. Enlarged and inflamed left submandibular gland with regional cellulitis, and secondary inflammation in the left submandibular and parapharyngeal spaces. No abscess. Favor acute infectious sialadenitis; no sialolithiasis or obstructing etiology identified. 2. Mild new paranasal sinus inflammation. 3. Chronic central skull base meningiomatosis. Electronically signed by: Helayne Hurst MD 02/26/2024 07:45 AM EST RP Workstation: [id number]    Disposition Plan & Communication  Patient status: Inpatient  Admitted From: Home Planned disposition location: Home Anticipated discharge date: 12/26 pending completion of IV Abx course  Family Communication: none at bedside    Author: Marien LITTIE Piety, DO [facility] 02/27/2024, 7:00 AM   Available by Epic secure chat 7AM-7PM. If 7PM-7AM, please contact night-coverage.  [facility] contact information found on [email or web address].  "

## 2024-02-27 NOTE — Progress Notes (Signed)
"   °  Chief Complaint  Patient presents with   neck swelling     S:  02/27/2024 Started unasyn  and reporting improvement in symptoms today - decreased pain and swelling.    CT reviewed - left SMG hypertrophy with surrounding fat stranding. No stones or abscess.   A/p Left submandibular gland sialadenitis - with no abscess or stone requiring surgical intervention -improving   - Warm compresses q2h while awake - IV antibiotics - transition to PO (augmentin  x 10 days) - sialogogues PRN - Massage area PRN - If symptomatically improved, anticipate discharge tomorrow and follow up with me early next week. Call for appt.    Available on [employer] and [employer] Penne Croak 02/26/2024 2:36 PM   Department of Otolaryngology Contact Info: Otolaryngology Front Desk Phone including questions about appointments or questions: [phone] - If after normal business hours (Monday-Friday after 5PM or Weekends/Holidays), please call same number and follow prompts for Patient Access Line. There is a physician on call for urgent matters. For life threatening emergencies, please call 911 "

## 2024-02-27 NOTE — Plan of Care (Signed)

## 2024-02-28 ENCOUNTER — Other Ambulatory Visit (HOSPITAL_COMMUNITY): Payer: Self-pay

## 2024-02-28 DIAGNOSIS — K1120 Sialoadenitis, unspecified: Secondary | ICD-10-CM | POA: Diagnosis not present

## 2024-02-28 DIAGNOSIS — R221 Localized swelling, mass and lump, neck: Secondary | ICD-10-CM | POA: Diagnosis not present

## 2024-02-28 LAB — CBC
HCT: 43.7 % (ref 39.0–52.0)
Hemoglobin: 14.6 g/dL (ref 13.0–17.0)
MCH: 29.4 pg (ref 26.0–34.0)
MCHC: 33.4 g/dL (ref 30.0–36.0)
MCV: 87.9 fL (ref 80.0–100.0)
Platelets: 160 K/uL (ref 150–400)
RBC: 4.97 MIL/uL (ref 4.22–5.81)
RDW: 12.7 % (ref 11.5–15.5)
WBC: 4.2 K/uL (ref 4.0–10.5)
nRBC: 0 % (ref 0.0–0.2)

## 2024-02-28 MED ORDER — AMOXICILLIN-POT CLAVULANATE 875-125 MG PO TABS
1.0000 | ORAL_TABLET | Freq: Two times a day (BID) | ORAL | 0 refills | Status: AC
  Filled 2024-02-28: qty 16, 8d supply, fill #0

## 2024-02-28 MED ORDER — AMOXICILLIN-POT CLAVULANATE 875-125 MG PO TABS
1.0000 | ORAL_TABLET | Freq: Two times a day (BID) | ORAL | 0 refills | Status: DC
  Filled 2024-02-28: qty 10, 5d supply, fill #0

## 2024-02-28 NOTE — Progress Notes (Signed)
 Patient lives home alone, has family for support and transportation home. No TOC needs identified at this time.

## 2024-02-28 NOTE — Plan of Care (Signed)

## 2024-02-28 NOTE — Progress Notes (Incomplete)
 " PROGRESS NOTE David Contreras    DOB: 01-15-1976, 48 y.o.  FMW:[id number]    Code Status: Full Code   DOA: 02/26/2024   LOS: 2  Brief hospital course  David Contreras is a 48 y.o. male with a PMH significant for HTN, GERD, meningioma (s/p craniotomy 2019, and gamma knife surgery earlier this year) who presented to the ED with left neck swelling. He does not have any fever.  Has only mild pain when swallowing.  He does not have any shortness of breath-he is not wheezing have any stridor.    CT showed inflamed left submandibular gland with regional cellulitis/inflammation without any obvious abscess. ENT, Dr. Anice recommended admission and IV Abx.  he was treated with Unasyn .   02/28/2024 -clinically stable and improved  Assessment & Plan  Principal Problem:   Neck swelling Active Problems:   [name], multiple (HCC)   HTN (hypertension)   GERD (gastroesophageal reflux disease)  Left neck/submandibular area swelling-likely secondary to sialadenitis. Was febrile overnight Likely infectious, no stone seen on CT imaging Appears stable-not in any distress-airway appears patent-no stridor Continue Unasyn  ENT following Supportive care Follow fever curve   Recent diagnosis of influenza A Symptoms are much better-appears stable Already out of the window for Tamiflu  Supportive care   HTN BP stable PTA Lisinopril    GERD PPI   History of meningioma-s/p craniotomy 2019-and gamma knife surgery at [facility] earlier this year Just completed a course of tapering Decadron  (8-9 days) around 5 days back. Follow [facility] post discharge  Body mass index is 34.41 kg/m.  VTE ppx: SCDs Start: 02/26/24 1634  Diet:     Diet   DIET SOFT Room service appropriate? Yes; Fluid consistency: Thin   Consultants: ENT  Subjective 02/28/2024    Pt reports doing well. His swallowing has improved and feels that the swelling on left has  lessoned.    Objective  Blood pressure 117/78, pulse 91, temperature 100 F (37.8 C), temperature source Oral, resp. rate 17, height 5' 11 (1.803 m), weight 111.9 kg, SpO2 93%. No intake or output data in the 24 hours ending 02/28/24 0703  Filed Weights   02/26/24 0610 02/26/24 1640  Weight: 108.9 kg 111.9 kg    Physical Exam:  General: awake, alert, NAD HEENT: soft, mildly tender left submandibular swelling. No overlying skin changes. Oropharynx patent, normal voice.  Respiratory: normal respiratory effort. Cardiovascular: extremities well perfused Nervous: A&O x3. no gross focal neurologic deficits, normal speech Extremities: moves all equally, no edema, normal tone Skin: dry, intact, normal temperature, normal color. No rashes, lesions or ulcers on exposed skin Psychiatry: normal mood, congruent affect  Labs   I have personally reviewed the following labs and imaging studies CBC    Component Value Date/Time   WBC 4.2 02/28/2024 0339   RBC 4.97 02/28/2024 0339   HGB 14.6 02/28/2024 0339   HCT 43.7 02/28/2024 0339   PLT 160 02/28/2024 0339   MCV 87.9 02/28/2024 0339   MCH 29.4 02/28/2024 0339   MCHC 33.4 02/28/2024 0339   RDW 12.7 02/28/2024 0339   LYMPHSABS 1.2 02/26/2024 0650   MONOABS 0.5 02/26/2024 0650   EOSABS 0.1 02/26/2024 0650   BASOSABS 0.0 02/26/2024 0650      Latest Ref Rng & Units 02/27/2024    3:27 AM 02/26/2024    5:46 PM 02/26/2024    6:50 AM  BMP  Glucose 70 - 99 mg/dL 885   875  BUN 6 - 20 mg/dL 13   14   Creatinine 9.38 - 1.24 mg/dL 9.21  9.11  9.21   Sodium 135 - 145 mmol/L 136   137   Potassium 3.5 - 5.1 mmol/L 4.2   4.6   Chloride 98 - 111 mmol/L 101   101   CO2 22 - 32 mmol/L 25   27   Calcium  8.9 - 10.3 mg/dL 8.7   9.4     CT Soft Tissue Neck W Contrast Result Date: 02/26/2024 EXAM: CT NECK WITH CONTRAST 02/26/2024 07:35:56 AM TECHNIQUE: CT of the neck was performed with the administration of 75 mL of iohexol  (OMNIPAQUE ) 300 MG/ML  solution. Multiplanar reformatted images are provided for review. Automated exposure control, iterative reconstruction, and/or weight based adjustment of the mA/kV was utilized to reduce the radiation dose to as low as reasonably achievable. COMPARISON: CT head 05/02/2019, brain MRI 06/05/2017. CLINICAL HISTORY: 48 year old male with history of a chronic, treated intracranial meningioma. Recent influenza. New onset left neck swelling. FINDINGS: AERODIGESTIVE TRACT: At the larynx, the glottis is closed. Laryngeal and pharyngeal soft tissue contours are within normal limits. Negative retropharyngeal and right parapharyngeal spaces. SALIVARY GLANDS: The left submandibular gland is asymmetrically enlarged, heterogeneously hyperenhancing, and indistinct (series 3 image 62 and series 5 image 109) with overlying thickening of the platysma and confluent fat stranding in both the subcutaneous and submandibular spaces. The left sublingual space is mostly spared. Confluent secondary inflammation is also present in the left parapharyngeal space at this level. No sialolithiasis or ductal dilatation is identified. The contralateral right submandibular gland is within normal limits. Parotid glands are within normal limits. Sublingual glands are within normal limits. THYROID: Unremarkable. LYMPH NODES: Small reactive appearing left level 1 and level 2 lymph nodes which are mildly hyperenhancing. The largest cervical lymph nodes are 9 to 10 mm short axis bilaterally. No cystic or necrotic nodes. SOFT TISSUES: Overlying thickening of the platysma and confluent fat stranding in both the subcutaneous and submandibular spaces associated with the left submandibular gland abnormality. No soft tissue gas. No discrete fluid collection. BONES: Partially visible lobulated and calcified central skull base enhancing masses compatible with known meningiomatosis (series 3 image 1). No acute osseous abnormality. No acute dental finding. OTHER:  Chronic postoperative changes of the left frontal sinus. Mild bilateral paranasal sinus mucosal thickening is increased. Tympanic cavities remain well aerated. Major vascular structures in the bilateral neck and at the skull base are enhancing and patent including the left internal jugular vein. Negative visible upper chest. Visualized lungs are clear. IMPRESSION: 1. Enlarged and inflamed left submandibular gland with regional cellulitis, and secondary inflammation in the left submandibular and parapharyngeal spaces. No abscess. Favor acute infectious sialadenitis; no sialolithiasis or obstructing etiology identified. 2. Mild new paranasal sinus inflammation. 3. Chronic central skull base meningiomatosis. Electronically signed by: Helayne Hurst MD 02/26/2024 07:45 AM EST RP Workstation: [id number]    Disposition Plan & Communication  Patient status: Inpatient  Admitted From: Home Planned disposition location: Home Anticipated discharge date: 12/26 pending completion of IV Abx course  Family Communication: none at bedside    Author: Marien LITTIE Piety, DO [facility] 02/28/2024, 7:03 AM   Available by Epic secure chat 7AM-7PM. If 7PM-7AM, please contact night-coverage.  [facility] contact information found on [email or web address].  "

## 2024-02-28 NOTE — Discharge Summary (Signed)
 "  Physician Discharge Summary  Patient: David Contreras FMW:[id number] DOB: [birth year]   Code Status: Full Code Admit date: 02/26/2024 Discharge date: 02/28/2024 Disposition: Home, No home health services recommended PCP: Mavis Redge SAILOR, FNP  Recommendations for Outpatient Follow-up:  Follow up with PCP within 1-2 weeks Regarding general hospital follow up and preventative care Follow up with ENT next week  Discharge Diagnoses:  Principal Problem:   Neck swelling Active Problems:   [name], multiple (HCC)   HTN (hypertension)   GERD (gastroesophageal reflux disease)   Sialadenitis  Brief Hospital Course Summary: EULOGIO REQUENA is a 48 y.o. male with a PMH significant for HTN, GERD, meningioma (s/p craniotomy 2019, and gamma knife surgery earlier this year) who presented to the ED with left neck swelling. He does not have any fever.  Has only mild pain when swallowing.  He does not have any shortness of breath-he is not wheezing have any stridor.    CT showed inflamed left submandibular gland with regional cellulitis/inflammation without any obvious abscess. ENT, Dr. Anice recommended admission and IV Abx.  he was treated with Unasyn  and tolerated well. His swelling improved and the odynophagia resolved.  He continued to have no respiratory impact.  He was discharged in good condition and was instructed to follow up outpatient with ENT.   All other chronic conditions were treated with home medications.    Discharge Condition: Good, improved Recommended discharge diet: Regular healthy diet  Consultations: ENT  Procedures/Studies: None   Allergies as of 02/28/2024       Reactions   Chicken Allergy Swelling        Medication List     STOP taking these medications    dexamethasone  2 MG tablet Commonly known as: DECADRON        TAKE these medications    acetaminophen  500 MG tablet Commonly known as: TYLENOL  Take 1,000 mg by mouth every 8 (eight)  hours as needed for mild pain (pain score 1-3) or moderate pain (pain score 4-6).   amoxicillin -clavulanate 875-125 MG tablet Commonly known as: AUGMENTIN  Take 1 tablet by mouth 2 (two) times daily for 8 days.   atorvastatin  40 MG tablet Commonly known as: LIPITOR Take 40 mg by mouth daily.   benzonatate  100 MG capsule Commonly known as: TESSALON  Take 1 capsule (100 mg total) by mouth every 8 (eight) hours.   Dupixent 300 MG/2ML Soaj Generic drug: Dupilumab Inject 300 mg into the skin once.   fluticasone  50 MCG/ACT nasal spray Commonly known as: FLONASE  Place 1 spray into both nostrils as needed for allergies.   lidocaine  2 % solution Commonly known as: XYLOCAINE  Use as directed 15 mLs in the mouth or throat every 6 (six) hours as needed for mouth pain.   lisinopril  20 MG tablet Commonly known as: ZESTRIL  Take 20 mg by mouth daily.   multivitamin with minerals Tabs tablet Take 1 tablet by mouth daily.   omeprazole  20 MG tablet Commonly known as: PRILOSEC  OTC Take 20 mg by mouth daily.        Follow-up Information     Anice Riis, DO Follow up in 1 week(s).   Specialty: Otolaryngology Contact information: [address] [place] [address] [phone]                Subjective   Pt reports feeling well. Denies odynophagia, dysphagia, voice change, or any breathing impact. His swelling has gone down.   All questions and concerns were addressed at time  of discharge.  Objective  Blood pressure 122/81, pulse (!) 59, temperature (!) 97.4 F (36.3 C), temperature source Oral, resp. rate 18, height 5' 11 (1.803 m), weight 111.9 kg, SpO2 99%.   General: Pt is alert, awake, not in acute distress HEENT: mild asymmetrical swelling of left submandibular area. Non-tender to palpation. No abnormality appreciated on oropharynx exam. Normal voice.  Cardiovascular: RRR, S1/S2 +, no rubs, no gallops Respiratory: CTA bilaterally, no wheezing, no  rhonchi Extremities: no edema, no cyanosis  The results of significant diagnostics from this hospitalization (including imaging, microbiology, ancillary and laboratory) are listed below for reference.   Imaging studies: CT Soft Tissue Neck W Contrast Result Date: 02/26/2024 EXAM: CT NECK WITH CONTRAST 02/26/2024 07:35:56 AM TECHNIQUE: CT of the neck was performed with the administration of 75 mL of iohexol  (OMNIPAQUE ) 300 MG/ML solution. Multiplanar reformatted images are provided for review. Automated exposure control, iterative reconstruction, and/or weight based adjustment of the mA/kV was utilized to reduce the radiation dose to as low as reasonably achievable. COMPARISON: CT head 05/02/2019, brain MRI 06/05/2017. CLINICAL HISTORY: 48 year old male with history of a chronic, treated intracranial meningioma. Recent influenza. New onset left neck swelling. FINDINGS: AERODIGESTIVE TRACT: At the larynx, the glottis is closed. Laryngeal and pharyngeal soft tissue contours are within normal limits. Negative retropharyngeal and right parapharyngeal spaces. SALIVARY GLANDS: The left submandibular gland is asymmetrically enlarged, heterogeneously hyperenhancing, and indistinct (series 3 image 62 and series 5 image 109) with overlying thickening of the platysma and confluent fat stranding in both the subcutaneous and submandibular spaces. The left sublingual space is mostly spared. Confluent secondary inflammation is also present in the left parapharyngeal space at this level. No sialolithiasis or ductal dilatation is identified. The contralateral right submandibular gland is within normal limits. Parotid glands are within normal limits. Sublingual glands are within normal limits. THYROID: Unremarkable. LYMPH NODES: Small reactive appearing left level 1 and level 2 lymph nodes which are mildly hyperenhancing. The largest cervical lymph nodes are 9 to 10 mm short axis bilaterally. No cystic or necrotic nodes. SOFT  TISSUES: Overlying thickening of the platysma and confluent fat stranding in both the subcutaneous and submandibular spaces associated with the left submandibular gland abnormality. No soft tissue gas. No discrete fluid collection. BONES: Partially visible lobulated and calcified central skull base enhancing masses compatible with known meningiomatosis (series 3 image 1). No acute osseous abnormality. No acute dental finding. OTHER: Chronic postoperative changes of the left frontal sinus. Mild bilateral paranasal sinus mucosal thickening is increased. Tympanic cavities remain well aerated. Major vascular structures in the bilateral neck and at the skull base are enhancing and patent including the left internal jugular vein. Negative visible upper chest. Visualized lungs are clear. IMPRESSION: 1. Enlarged and inflamed left submandibular gland with regional cellulitis, and secondary inflammation in the left submandibular and parapharyngeal spaces. No abscess. Favor acute infectious sialadenitis; no sialolithiasis or obstructing etiology identified. 2. Mild new paranasal sinus inflammation. 3. Chronic central skull base meningiomatosis. Electronically signed by: Helayne Hurst MD 02/26/2024 07:45 AM EST RP Workstation: [id number]    Labs: Basic Metabolic Panel: Recent Labs  Lab 02/26/24 0650 02/26/24 1746 02/27/24 0327  NA 137  --  136  K 4.6  --  4.2  CL 101  --  101  CO2 27  --  25  GLUCOSE 124*  --  114*  BUN 14  --  13  CREATININE 0.78 0.88 0.78  CALCIUM  9.4  --  8.7*   CBC: Recent  Labs  Lab 02/26/24 0650 02/26/24 1746 02/27/24 0327 02/28/24 0339  WBC 8.1 6.1 5.5 4.2  NEUTROABS 6.2  --   --   --   HGB 15.6 16.1 15.3 14.6  HCT 45.4 47.7 44.1 43.7  MCV 86.0 86.3 85.3 87.9  PLT 215 225 181 160   Microbiology: Results for orders placed or performed during the hospital encounter of 02/24/24  Resp panel by RT-PCR (RSV, Flu A&B, Covid) Anterior Nasal Swab     Status: Abnormal   Collection  Time: 02/24/24 11:33 AM   Specimen: Anterior Nasal Swab  Result Value Ref Range Status   SARS Coronavirus 2 by RT PCR NEGATIVE NEGATIVE Final    Comment: (NOTE) SARS-CoV-2 target nucleic acids are NOT DETECTED.  The SARS-CoV-2 RNA is generally detectable in upper respiratory specimens during the acute phase of infection. The lowest concentration of SARS-CoV-2 viral copies this assay can detect is 138 copies/mL. A negative result does not preclude SARS-Cov-2 infection and should not be used as the sole basis for treatment or other patient management decisions. A negative result may occur with  improper specimen collection/handling, submission of specimen other than nasopharyngeal swab, presence of viral mutation(s) within the areas targeted by this assay, and inadequate number of viral copies(<138 copies/mL). A negative result must be combined with clinical observations, patient history, and epidemiological information. The expected result is Negative.  Fact Sheet for Patients:  bloggercourse.com  Fact Sheet for Healthcare Providers:  seriousbroker.it  This test is no t yet approved or cleared by the [place]  FDA and  has been authorized for detection and/or diagnosis of SARS-CoV-2 by FDA under an Emergency Use Authorization (EUA). This EUA will remain  in effect (meaning this test can be used) for the duration of the COVID-19 declaration under Section 564(b)(1) of the Act, 21 U.S.C.section 360bbb-3(b)(1), unless the authorization is terminated  or revoked sooner.       Influenza A by PCR POSITIVE (A) NEGATIVE Final   Influenza B by PCR NEGATIVE NEGATIVE Final    Comment: (NOTE) The Xpert Xpress SARS-CoV-2/FLU/RSV plus assay is intended as an aid in the diagnosis of influenza from Nasopharyngeal swab specimens and should not be used as a sole basis for treatment. Nasal washings and aspirates are unacceptable for Xpert  Xpress SARS-CoV-2/FLU/RSV testing.  Fact Sheet for Patients: bloggercourse.com  Fact Sheet for Healthcare Providers: seriousbroker.it  This test is not yet approved or cleared by the [place]  FDA and has been authorized for detection and/or diagnosis of SARS-CoV-2 by FDA under an Emergency Use Authorization (EUA). This EUA will remain in effect (meaning this test can be used) for the duration of the COVID-19 declaration under Section 564(b)(1) of the Act, 21 U.S.C. section 360bbb-3(b)(1), unless the authorization is terminated or revoked.     Resp Syncytial Virus by PCR NEGATIVE NEGATIVE Final    Comment: (NOTE) Fact Sheet for Patients: bloggercourse.com  Fact Sheet for Healthcare Providers: seriousbroker.it  This test is not yet approved or cleared by the [place]  FDA and has been authorized for detection and/or diagnosis of SARS-CoV-2 by FDA under an Emergency Use Authorization (EUA). This EUA will remain in effect (meaning this test can be used) for the duration of the COVID-19 declaration under Section 564(b)(1) of the Act, 21 U.S.C. section 360bbb-3(b)(1), unless the authorization is terminated or revoked.  Performed at [facility], [address] [place], [place] [address]     Time coordinating discharge: Over 30 minutes  Capucine Tryon L  Lenon, MD  [facility] 02/28/2024, 9:35 AM  "

## 2024-02-28 NOTE — Discharge Instructions (Addendum)
 Please continue your antibiotic until it is completed.  Follow up with your PCP within 1-2 weeks. Follow up with ENT, Dr. Anice next week.   Otolaryngology [employer] including questions about appointments or questions: [phone] - If after normal business hours (Monday-Friday after 5PM or Weekends/Holidays), please call same number and follow prompts for Patient Access Line. There is a physician on call for urgent matters.  Return to the ED if you have difficulty with swallowing or breathing or think your swelling is worsening. For life threatening emergencies, please call 911

## 2024-02-28 NOTE — Progress Notes (Signed)
"   °  Chief Complaint  Patient presents with   neck swelling     S:  02/28/2024 PSE. Decreased swelling. Decreased pain. Still on IV abx. Tolerating PO.   12/25 Started unasyn  and reporting improvement in symptoms today - decreased pain and swelling.    CT reviewed - left SMG hypertrophy with surrounding fat stranding. No stones or abscess.   BP 122/81 (BP Location: Left Arm)   Pulse (!) 59   Temp (!) 97.4 F (36.3 C) (Oral)   Resp 18   Ht 5' 11 (1.803 m)   Wt 111.9 kg   SpO2 99%   BMI 34.41 kg/m  General: Well developed, well nourished. No acute distress. Voice normal Head/Face: Normocephalic. No sinus tenderness. Facial nerve intact and equal bilaterally. No facial lacerations. Eyes: PERRL, no scleral icterus or conjunctival hemorrhage. EOMI. Ears: No gross deformity. Normal external canal.  Hearing: Normal speech reception.  Nose: No gross deformity or lesions.  Mouth/Oropharynx: Lips without any lesions. Lymphatic: No lymphadenopathy in the neck. Extremities: No edema or cyanosis. Warm and well-perfused. Skin: No scars or lesions on face or neck. Neurologic: CN II-XII grossly intact. Moving all extremities without gross abnormality. Other:  Left neck with SMG induration - moderate. Neck otherwise soft. Full ROM.   A/p Left submandibular gland sialadenitis - with no abscess or stone requiring surgical intervention -improving   - Warm compresses q2h while awake - Okay for discharge with augmentin  x 10 days - sialogogues PRN - Massage area PRN -  follow up with me early next week. Call for appt.    Available on [employer]iku and [employer] Penne Croak 02/26/2024 2:36 PM   Department of Otolaryngology Contact Info: Otolaryngology Front Desk Phone including questions about appointments or questions: [phone] - If after normal business hours (Monday-Friday after 5PM or Weekends/Holidays), please call same number and follow prompts for Patient Access Line. There  is a physician on call for urgent matters. For life threatening emergencies, please call 911 "
